# Patient Record
Sex: Female | Born: 1985
Health system: Southern US, Community
[De-identification: ages and names within clinical notes are randomized; demographics above are authoritative.]

## PROBLEM LIST (undated history)

## (undated) DIAGNOSIS — Z789 Other specified health status: Secondary | ICD-10-CM

## (undated) DIAGNOSIS — Z3490 Encounter for supervision of normal pregnancy, unspecified, unspecified trimester: Secondary | ICD-10-CM

## (undated) DIAGNOSIS — K219 Gastro-esophageal reflux disease without esophagitis: Secondary | ICD-10-CM

## (undated) HISTORY — PX: WISDOM TOOTH EXTRACTION: SHX21

---
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
## 2007-07-11 ENCOUNTER — Emergency Department (HOSPITAL_COMMUNITY): Admission: EM | Admit: 2007-07-11 | Discharge: 2007-07-11 | Payer: Self-pay | Admitting: Family Medicine

## 2007-07-22 ENCOUNTER — Inpatient Hospital Stay (HOSPITAL_COMMUNITY): Admission: AD | Admit: 2007-07-22 | Discharge: 2007-07-22 | Payer: Self-pay | Admitting: Obstetrics & Gynecology

## 2007-07-25 ENCOUNTER — Inpatient Hospital Stay (HOSPITAL_COMMUNITY): Admission: AD | Admit: 2007-07-25 | Discharge: 2007-07-26 | Payer: Self-pay | Admitting: Obstetrics and Gynecology

## 2007-09-04 ENCOUNTER — Inpatient Hospital Stay (HOSPITAL_COMMUNITY): Admission: AD | Admit: 2007-09-04 | Discharge: 2007-09-04 | Payer: Self-pay | Admitting: Obstetrics & Gynecology

## 2007-09-20 ENCOUNTER — Inpatient Hospital Stay (HOSPITAL_COMMUNITY): Admission: AD | Admit: 2007-09-20 | Discharge: 2007-09-20 | Payer: Self-pay | Admitting: Obstetrics & Gynecology

## 2007-11-18 ENCOUNTER — Ambulatory Visit (HOSPITAL_COMMUNITY): Admission: RE | Admit: 2007-11-18 | Discharge: 2007-11-18 | Payer: Self-pay | Admitting: Family Medicine

## 2008-02-18 ENCOUNTER — Inpatient Hospital Stay (HOSPITAL_COMMUNITY): Admission: AD | Admit: 2008-02-18 | Discharge: 2008-02-19 | Payer: Self-pay | Admitting: Obstetrics & Gynecology

## 2008-02-26 ENCOUNTER — Inpatient Hospital Stay (HOSPITAL_COMMUNITY): Admission: AD | Admit: 2008-02-26 | Discharge: 2008-02-27 | Payer: Self-pay | Admitting: Obstetrics & Gynecology

## 2008-02-27 ENCOUNTER — Inpatient Hospital Stay (HOSPITAL_COMMUNITY): Admission: AD | Admit: 2008-02-27 | Discharge: 2008-02-27 | Payer: Self-pay | Admitting: Family Medicine

## 2008-02-27 ENCOUNTER — Inpatient Hospital Stay (HOSPITAL_COMMUNITY): Admission: AD | Admit: 2008-02-27 | Discharge: 2008-03-01 | Payer: Self-pay | Admitting: Obstetrics & Gynecology

## 2008-02-27 ENCOUNTER — Ambulatory Visit: Payer: Self-pay | Admitting: Physician Assistant

## 2009-01-19 ENCOUNTER — Emergency Department (HOSPITAL_COMMUNITY): Admission: EM | Admit: 2009-01-19 | Discharge: 2009-01-19 | Payer: Self-pay | Admitting: Emergency Medicine

## 2009-04-07 ENCOUNTER — Emergency Department (HOSPITAL_COMMUNITY): Admission: EM | Admit: 2009-04-07 | Discharge: 2009-04-08 | Payer: Self-pay | Admitting: Emergency Medicine

## 2009-05-25 ENCOUNTER — Encounter: Admission: RE | Admit: 2009-05-25 | Discharge: 2009-05-25 | Payer: Self-pay | Admitting: Family Medicine

## 2010-05-28 LAB — DIFFERENTIAL
Basophils Absolute: 0 10*3/uL (ref 0.0–0.1)
Eosinophils Relative: 0 % (ref 0–5)
Lymphocytes Relative: 12 % (ref 12–46)
Monocytes Absolute: 0.6 10*3/uL (ref 0.1–1.0)
Neutro Abs: 7.5 10*3/uL (ref 1.7–7.7)
Neutrophils Relative %: 81 % — ABNORMAL HIGH (ref 43–77)

## 2010-05-28 LAB — COMPREHENSIVE METABOLIC PANEL
ALT: 70 U/L — ABNORMAL HIGH (ref 0–35)
Alkaline Phosphatase: 75 U/L (ref 39–117)
CO2: 29 mEq/L (ref 19–32)
GFR calc Af Amer: >60 mL/min (ref >60)
GFR calc non Af Amer: >60 mL/min (ref >60)
Glucose, Bld: 88 mg/dL (ref 70–99)
Potassium: 3.8 mEq/L (ref 3.5–5.1)

## 2010-05-28 LAB — CBC
HCT: 38.8 % (ref 36.0–46.0)
MCV: 84.8 fL (ref 78.0–100.0)
RBC: 4.57 MIL/uL (ref 3.87–5.11)
WBC: 9.3 10*3/uL (ref 4.0–10.5)

## 2010-05-28 LAB — URINE MICROSCOPIC-ADD ON

## 2010-05-28 LAB — LIPASE, BLOOD: Lipase: 72 U/L — ABNORMAL HIGH (ref 11–59)

## 2010-05-28 LAB — PREGNANCY, URINE: Preg Test, Ur: NEGATIVE

## 2010-05-28 LAB — URINALYSIS, ROUTINE W REFLEX MICROSCOPIC
Hgb urine dipstick: NEGATIVE
Nitrite: NEGATIVE
Protein, ur: NEGATIVE mg/dL
Urobilinogen, UA: 1 mg/dL (ref 0.0–1.0)

## 2010-11-24 ENCOUNTER — Other Ambulatory Visit: Payer: Self-pay | Admitting: Family Medicine

## 2010-11-24 DIAGNOSIS — N63 Unspecified lump in unspecified breast: Secondary | ICD-10-CM

## 2010-11-30 ENCOUNTER — Ambulatory Visit
Admission: RE | Admit: 2010-11-30 | Discharge: 2010-11-30 | Disposition: A | Discharge status: Home / Self Care | Payer: No Typology Code available for payment source | Source: Ambulatory Visit | Attending: Family Medicine | Admitting: Family Medicine

## 2010-11-30 DIAGNOSIS — N63 Unspecified lump in unspecified breast: Secondary | ICD-10-CM

## 2010-12-06 LAB — URINALYSIS, ROUTINE W REFLEX MICROSCOPIC
Bilirubin Urine: NEGATIVE
Hgb urine dipstick: NEGATIVE
Specific Gravity, Urine: >1.03 — ABNORMAL HIGH
Urobilinogen, UA: 0.2
pH: 5.5

## 2010-12-06 LAB — WET PREP, GENITAL
Clue Cells Wet Prep HPF POC: NONE SEEN
Trich, Wet Prep: NONE SEEN
Yeast Wet Prep HPF POC: NONE SEEN

## 2010-12-07 LAB — URINALYSIS, ROUTINE W REFLEX MICROSCOPIC
Ketones, ur: 15 — AB
Nitrite: NEGATIVE
Protein, ur: 30 — AB
pH: 8

## 2010-12-07 LAB — CBC
Hemoglobin: 12
MCHC: 33.6
Platelets: 242
RBC: 4.28
WBC: 5

## 2010-12-07 LAB — COMPREHENSIVE METABOLIC PANEL
AST: 71 — ABNORMAL HIGH
Albumin: 3.6
BUN: 2 — ABNORMAL LOW
Calcium: 9.9
Creatinine, Ser: 0.47
GFR calc Af Amer: >60

## 2010-12-07 LAB — URINE MICROSCOPIC-ADD ON

## 2010-12-15 LAB — URINALYSIS, ROUTINE W REFLEX MICROSCOPIC
Bilirubin Urine: NEGATIVE
Glucose, UA: NEGATIVE mg/dL
Hgb urine dipstick: NEGATIVE
Ketones, ur: NEGATIVE mg/dL
Nitrite: NEGATIVE
Protein, ur: NEGATIVE mg/dL
Specific Gravity, Urine: <1.005 — ABNORMAL LOW (ref 1.005–1.030)
Urobilinogen, UA: 1 mg/dL (ref 0.0–1.0)
pH: 6.5 (ref 5.0–8.0)

## 2010-12-15 LAB — CBC
MCV: 83.4 fL (ref 78.0–100.0)
Platelets: 264 10*3/uL (ref 150–400)
WBC: 10.1 10*3/uL (ref 4.0–10.5)

## 2010-12-21 LAB — URINALYSIS, ROUTINE W REFLEX MICROSCOPIC
Bilirubin Urine: NEGATIVE
Hgb urine dipstick: NEGATIVE
Ketones, ur: NEGATIVE
Nitrite: NEGATIVE
Protein, ur: NEGATIVE
Urobilinogen, UA: 0.2

## 2010-12-21 LAB — WET PREP, GENITAL
Clue Cells Wet Prep HPF POC: NONE SEEN
Trich, Wet Prep: NONE SEEN
Yeast Wet Prep HPF POC: NONE SEEN

## 2010-12-21 LAB — GC/CHLAMYDIA PROBE AMP, GENITAL: GC Probe Amp, Genital: NEGATIVE

## 2010-12-21 LAB — PREGNANCY, URINE: Preg Test, Ur: NEGATIVE

## 2011-06-14 ENCOUNTER — Other Ambulatory Visit: Payer: Self-pay | Admitting: Family Medicine

## 2012-02-08 ENCOUNTER — Encounter (HOSPITAL_COMMUNITY): Payer: Self-pay | Admitting: *Deleted

## 2012-02-08 ENCOUNTER — Emergency Department (HOSPITAL_COMMUNITY): Payer: Self-pay

## 2012-02-08 ENCOUNTER — Emergency Department (HOSPITAL_COMMUNITY)
Admission: EM | Admit: 2012-02-08 | Discharge: 2012-02-08 | Disposition: A | Discharge status: Home / Self Care | Payer: Self-pay | Attending: Emergency Medicine | Admitting: Emergency Medicine

## 2012-02-08 DIAGNOSIS — R079 Chest pain, unspecified: Secondary | ICD-10-CM

## 2012-02-08 DIAGNOSIS — R0789 Other chest pain: Secondary | ICD-10-CM | POA: Insufficient documentation

## 2012-02-08 DIAGNOSIS — F17200 Nicotine dependence, unspecified, uncomplicated: Secondary | ICD-10-CM | POA: Insufficient documentation

## 2012-02-08 MED ORDER — ONDANSETRON 8 MG PO TBDP
8.0000 mg | ORAL_TABLET | Freq: Once | ORAL | Status: AC
  Administered 2012-02-08: 8 mg via ORAL
  Filled 2012-02-08: qty 1

## 2012-02-08 MED ORDER — HYDROCODONE-ACETAMINOPHEN 5-325 MG PO TABS
1.0000 | ORAL_TABLET | Freq: Once | ORAL | Status: AC
  Administered 2012-02-08: 1 via ORAL
  Filled 2012-02-08: qty 1

## 2012-02-08 MED ORDER — HYDROCODONE-ACETAMINOPHEN 5-325 MG PO TABS: ORAL_TABLET | ORAL | Status: DC

## 2012-02-08 MED ORDER — OXYCODONE-ACETAMINOPHEN 5-325 MG PO TABS: 1.0000 | ORAL_TABLET | Freq: Four times a day (QID) | ORAL | Status: DC | PRN

## 2012-02-08 NOTE — ED Provider Notes (Signed)
Medical screening examination/treatment/procedure(s) were performed by non-physician practitioner and as supervising physician I was immediately available for consultation/collaboration.   Gavin Pound. Oletta Lamas, MD 02/08/12 2312

## 2012-02-08 NOTE — ED Notes (Signed)
Pt states she has chest pain that goes and comes, places hand over epigastric area, has been seen several times with same.

## 2012-02-08 NOTE — ED Notes (Signed)
Pt ready for d/c. Left room prior to signing.

## 2012-02-08 NOTE — ED Notes (Signed)
Pt now vomiting.  meds given.

## 2012-02-08 NOTE — ED Provider Notes (Signed)
History     CSN: [id number]  Arrival date & time 02/08/12  1632   First MD Initiated Contact with Patient 02/08/12 1654      Chief Complaint  Patient presents with  . Abdominal Pain    epigastric    (Consider location/radiation/quality/duration/timing/severity/associated sxs/prior treatment) HPI Comments: Patient presents with complaint of sternal chest pain that is intermittent. She has had this in the past several times, presented to the ED, and was never given an explanation for pain. Pain became worse today. It is not changed with movement or deep breathing. She has taken Tylenol today without relief as well as an acid medicine. She denies injury. No shortness of breath. No abdominal pain, fever, nausea, vomiting. No urinary symptoms. Patient has brain. No lower extremity swelling or tenderness. No recent immobilizations or long travel. No history of blood clots. No history of sudden death in family. Onset gradual. Course is constant. Patient is a smoker. Nothing makes symptoms better or worse.   The history is provided by the patient.    History reviewed. No pertinent past medical history.  History reviewed. No pertinent past surgical history.  No family history on file.  History  Substance Use Topics  . Smoking status: Current Every Day Smoker  . Smokeless tobacco: Not on file  . Alcohol Use: Yes     Comment: occ    OB History    Grav Para Term Preterm Abortions TAB SAB Ect Mult Living                  Review of Systems  Constitutional: Negative for fever.  HENT: Negative for sore throat and rhinorrhea.   Eyes: Negative for redness.  Respiratory: Negative for cough and shortness of breath.   Cardiovascular: Positive for chest pain. Negative for leg swelling.  Gastrointestinal: Negative for nausea, vomiting, abdominal pain and diarrhea.  Genitourinary: Negative for dysuria.  Musculoskeletal: Negative for myalgias.  Skin: Negative for rash.  Neurological:  Negative for headaches.    Allergies  Review of patient's allergies indicates no known allergies.  Home Medications   Current Outpatient Rx  Name  Route  Sig  Dispense  Refill  . ACETAMINOPHEN 500 MG PO TABS   Oral   Take 1,000 mg by mouth once. pain         . FAMOTIDINE 20 MG PO TABS   Oral   Take 20 mg by mouth once.           BP 97/63  Pulse 59  Temp 98.1 F (36.7 C) (Oral)  Resp 16  SpO2 100%  Physical Exam  Nursing note and vitals reviewed. Constitutional: She appears well-developed and well-nourished.  HENT:  Head: Normocephalic and atraumatic.  Eyes: Conjunctivae normal are normal. Right eye exhibits no discharge. Left eye exhibits no discharge.  Neck: Normal range of motion. Neck supple.  Cardiovascular: Normal rate, regular rhythm and normal heart sounds.   Pulmonary/Chest: Effort normal and breath sounds normal. No respiratory distress. She has no wheezes. She has no rales. She exhibits no tenderness.  Abdominal: Soft. There is no tenderness.  Neurological: She is alert.  Skin: Skin is warm and dry.  Psychiatric: She has a normal mood and affect.    ED Course  Procedures (including critical care time)  Labs Reviewed - No data to display No results found.   1. Chest pain     5:32 PM Patient seen and examined. Work-up initiated. Medications ordered. Previous visits reviewed.   Vital  signs reviewed and are as follows: Filed Vitals:   02/08/12 1643  BP: 97/63  Pulse: 59  Temp: 98.1 F (36.7 C)  Resp: 16    Date: 02/08/2012  Rate: 53  Rhythm: sinus bradycardia  QRS Axis: normal  Intervals: normal  ST/T Wave abnormalities: nonspecific T wave changes  Conduction Disutrbances:none  Narrative Interpretation:   Old EKG Reviewed: unchanged from 01/19/10 except for new bradycardia, t-wave abnormality pre-existing.   CXR neg. EKG reviewed. D/w Dr. Oletta Lamas. Pt will be d/c to home with ibuprofen, prilosec, and percocet (vomited in ED after  hydrocodone).   Patient was counseled to return with severe chest pain, especially if the pain is crushing or pressure-like and spreads to the arms, back, neck, or jaw, or if they have sweating, nausea, or shortness of breath with the pain. They were encouraged to call 911 with these symptoms.   They were also told to return if their chest pain gets worse and does not go away with rest, they have an attack of chest pain lasting longer than usual despite rest and treatment with the medications their caregiver has prescribed, if they wake from sleep with chest pain or shortness of breath, if they feel dizzy or faint, if they have chest pain not typical of their usual pain, or if they have any other emergent concerns regarding their health.  The patient verbalized understanding and agreed.    MDM  Pt with chest pain that is similar to previous episodes that resolved spontaneously in the past with PPI, pain medicine, anti-inflammatories. She is not short of breath. She does not have tachycardia or pleuritic chest pain. I do not suspect that this represents a PE. This is likely more of a chest wall pain. Doubt pericarditis. No pneumonia on chest x-ray. Will treat as previous. Patient appears well.         Renne Crigler, [place] 02/08/12 2033

## 2014-04-09 ENCOUNTER — Encounter (HOSPITAL_COMMUNITY): Payer: Self-pay | Admitting: Emergency Medicine

## 2014-04-09 ENCOUNTER — Emergency Department (HOSPITAL_COMMUNITY): Payer: Self-pay

## 2014-04-09 ENCOUNTER — Emergency Department (HOSPITAL_COMMUNITY)
Admission: EM | Admit: 2014-04-09 | Discharge: 2014-04-09 | Disposition: A | Discharge status: Home / Self Care | Payer: Self-pay | Attending: Emergency Medicine | Admitting: Emergency Medicine

## 2014-04-09 DIAGNOSIS — R1013 Epigastric pain: Secondary | ICD-10-CM

## 2014-04-09 DIAGNOSIS — R079 Chest pain, unspecified: Secondary | ICD-10-CM | POA: Insufficient documentation

## 2014-04-09 DIAGNOSIS — Z87891 Personal history of nicotine dependence: Secondary | ICD-10-CM | POA: Insufficient documentation

## 2014-04-09 DIAGNOSIS — K8050 Calculus of bile duct without cholangitis or cholecystitis without obstruction: Secondary | ICD-10-CM | POA: Insufficient documentation

## 2014-04-09 LAB — CBC WITH DIFFERENTIAL/PLATELET
BASOS ABS: 0 10*3/uL (ref 0.0–0.1)
BASOS PCT: 0 % (ref 0–1)
EOS ABS: 0.1 10*3/uL (ref 0.0–0.7)
Eosinophils Relative: 3 % (ref 0–5)
HCT: 36.8 % (ref 36.0–46.0)
Hemoglobin: 11.7 g/dL — ABNORMAL LOW (ref 12.0–15.0)
LYMPHS PCT: 31 % (ref 12–46)
Lymphs Abs: 1.5 10*3/uL (ref 0.7–4.0)
MCH: 27.9 pg (ref 26.0–34.0)
MCHC: 31.8 g/dL (ref 30.0–36.0)
MCV: 87.8 fL (ref 78.0–100.0)
Monocytes Absolute: 0.4 10*3/uL (ref 0.1–1.0)
Monocytes Relative: 9 % (ref 3–12)
Neutro Abs: 2.8 10*3/uL (ref 1.7–7.7)
Neutrophils Relative %: 57 % (ref 43–77)
PLATELETS: 222 10*3/uL (ref 150–400)
RBC: 4.19 MIL/uL (ref 3.87–5.11)
RDW: 13.8 % (ref 11.5–15.5)
WBC: 4.9 10*3/uL (ref 4.0–10.5)

## 2014-04-09 LAB — BASIC METABOLIC PANEL
Anion gap: 8 (ref 5–15)
BUN: 12 mg/dL (ref 6–23)
CO2: 26 mmol/L (ref 19–32)
CREATININE: 0.64 mg/dL (ref 0.50–1.10)
Calcium: 9.3 mg/dL (ref 8.4–10.5)
Chloride: 105 mmol/L (ref 96–112)
GFR calc non Af Amer: >90 mL/min (ref >90)
GLUCOSE: 91 mg/dL (ref 70–99)
Potassium: 3.8 mmol/L (ref 3.5–5.1)
SODIUM: 139 mmol/L (ref 135–145)

## 2014-04-09 LAB — HEPATIC FUNCTION PANEL
ALT: 12 U/L (ref 0–35)
AST: 17 U/L (ref 0–37)
Albumin: 4.1 g/dL (ref 3.5–5.2)
Alkaline Phosphatase: 53 U/L (ref 39–117)
BILIRUBIN TOTAL: 0.4 mg/dL (ref 0.3–1.2)
Total Protein: 7.4 g/dL (ref 6.0–8.3)

## 2014-04-09 LAB — I-STAT TROPONIN, ED: Troponin i, poc: 0 ng/mL (ref 0.00–0.08)

## 2014-04-09 LAB — I-STAT BETA HCG BLOOD, ED (MC, WL, AP ONLY): I-stat hCG, quantitative: <5 m[IU]/mL (ref <5)

## 2014-04-09 LAB — LIPASE, BLOOD: LIPASE: 23 U/L (ref 11–59)

## 2014-04-09 MED ORDER — OXYCODONE-ACETAMINOPHEN 5-325 MG PO TABS: 1.0000 | ORAL_TABLET | Freq: Four times a day (QID) | ORAL | Status: DC | PRN

## 2014-04-09 MED ORDER — OXYCODONE-ACETAMINOPHEN 5-325 MG PO TABS
1.0000 | ORAL_TABLET | Freq: Once | ORAL | Status: AC
  Administered 2014-04-09: 1 via ORAL
  Filled 2014-04-09: qty 1

## 2014-04-09 NOTE — ED Provider Notes (Signed)
CSN: [id number]     Arrival date & time 04/09/14  0545 History   First MD Initiated Contact with Patient 04/09/14 0700     Chief Complaint  Patient presents with  . Chest Pain     (Consider location/radiation/quality/duration/timing/severity/associated sxs/prior Treatment) Patient is a 29 y.o. female presenting with chest pain. The history is provided by the patient.  Chest Pain Associated symptoms: abdominal pain and nausea   Associated symptoms: no back pain, no headache, no numbness, no shortness of breath, not vomiting and no weakness    patient with chest pain. Began last night. States it was 10 out of 10 but is now 7 out of 10. States she has had episodes like this in the past but only comes once every blue moon. She states she's been told it was gallstones she states one time it was popped blood vessel and one time they were not able to tell her what it was. Rates the pain starts her abdomen goes up. Mild nausea without vomiting. The pain is dull and constant. She did not eat breakfast. It woke her from sleep. No fevers. No diaphoresis. She does have decreased appetite. No weight loss.  History reviewed. No pertinent past medical history. History reviewed. No pertinent past surgical history. History reviewed. No pertinent family history. History  Substance Use Topics  . Smoking status: Former Research scientist (life sciences)  . Smokeless tobacco: Not on file  . Alcohol Use: Yes     Comment: occ   OB History    No data available     Review of Systems  Constitutional: Negative for activity change and appetite change.  Eyes: Negative for pain.  Respiratory: Negative for chest tightness and shortness of breath.   Cardiovascular: Positive for chest pain. Negative for leg swelling.  Gastrointestinal: Positive for nausea and abdominal pain. Negative for vomiting and diarrhea.  Genitourinary: Negative for flank pain.  Musculoskeletal: Negative for back pain and neck stiffness.  Skin: Negative for rash.   Neurological: Negative for weakness, numbness and headaches.  Psychiatric/Behavioral: Negative for behavioral problems.      Allergies  Review of patient's allergies indicates no known allergies.  Home Medications   Prior to Admission medications   Medication Sig Start Date End Date Taking? Authorizing Provider  acetaminophen (TYLENOL) 500 MG tablet Take 1,000 mg by mouth once. pain    Historical Provider, MD  famotidine (PEPCID) 20 MG tablet Take 20 mg by mouth once.    Historical Provider, MD  oxyCODONE-acetaminophen (PERCOCET/ROXICET) 5-325 MG per tablet Take 1-2 tablets by mouth every 6 (six) hours as needed. 04/09/14   Jasper Riling. Nataliee Shurtz, MD   BP 104/59 mmHg  Pulse 80  Temp(Src) 97.9 F (36.6 C) (Oral)  Resp 18  SpO2 100%  LMP 03/26/2014 (Approximate) Physical Exam  Constitutional: She appears well-developed.  HENT:  Head: Normocephalic.  Cardiovascular: Normal rate and regular rhythm.   Pulmonary/Chest: Effort normal.  Abdominal: Soft. There is tenderness.  Epigastric tenderness without rebound or guarding.  Musculoskeletal: Normal range of motion.  Neurological: She is alert.  Skin: Skin is warm.    ED Course  Procedures (including critical care time) Labs Review Labs Reviewed  CBC WITH DIFFERENTIAL/PLATELET - Abnormal; Notable for the following:    Hemoglobin 11.7 (*)    All other components within normal limits  BASIC METABOLIC PANEL  HEPATIC FUNCTION PANEL  LIPASE, BLOOD  I-STAT BETA HCG BLOOD, ED ([facility], [facility], [facility] ONLY)  I-STAT TROPOININ, ED    Imaging Review Dg Chest  2 View  04/09/2014   CLINICAL DATA:  New onset Mid chest pain since last night. Nonsmoker.  EXAM: CHEST  2 VIEW  COMPARISON:  02/08/2012  FINDINGS: The heart size and mediastinal contours are within normal limits. Both lungs are clear. The visualized skeletal structures are unremarkable.  IMPRESSION: No active cardiopulmonary disease.   Electronically Signed   By: Lucienne Capers M.D.   On:  04/09/2014 06:59     EKG Interpretation   Date/Time:  Friday April 09 2014 06:14:49 EST Ventricular Rate:  64 PR Interval:  145 QRS Duration: 71 QT Interval:  394 QTC Calculation: 406 R Axis:   81 Text Interpretation:  Sinus rhythm ST elevation, consider inferior injury  have not changed Confirmed by NANAVATI, MD, ANKIT [phone]) on 04/09/2014  6:52:37 AM      MDM   Final diagnoses:  Chest pain  Biliary colic  Epigastric pain    Patient with chest pain and epigastric pain. Has had history of same and has had previous gallstones. This may be biliary colic. Laboratory reassuring. Will discharge and a follow-up with general surgery as needed. Will not repeat ultrasound at this time.    Jasper Riling. Alvino Chapel, MD 04/09/14 1539

## 2014-04-09 NOTE — Discharge Instructions (Signed)
Biliary Colic  °Biliary colic is a steady or irregular pain in the upper abdomen. It is usually under the right side of the rib cage. It happens when gallstones interfere with the normal flow of bile from the gallbladder. Bile is a liquid that helps to digest fats. Bile is made in the liver and stored in the gallbladder. When you eat a meal, bile passes from the gallbladder through the cystic duct and the common bile duct into the small intestine. There, it mixes with partially digested food. If a gallstone blocks either of these ducts, the normal flow of bile is blocked. The muscle cells in the bile duct contract forcefully to try to move the stone. This causes the pain of biliary colic.  °SYMPTOMS  °· A person with biliary colic usually complains of pain in the upper abdomen. This pain can be: °· In the center of the upper abdomen just below the breastbone. °· In the upper-right part of the abdomen, near the gallbladder and liver. °· Spread back toward the right shoulder blade. °· Nausea and vomiting. °· The pain usually occurs after eating. °· Biliary colic is usually triggered by the digestive system's demand for bile. The demand for bile is high after fatty meals. Symptoms can also occur when a person who has been fasting suddenly eats a very large meal. Most episodes of biliary colic pass after 1 to 5 hours. After the most intense pain passes, your abdomen may continue to ache mildly for about 24 hours. °DIAGNOSIS  °After you describe your symptoms, your caregiver will perform a physical exam. He or she will pay attention to the upper right portion of your belly (abdomen). This is the area of your liver and gallbladder. An ultrasound will help your caregiver look for gallstones. Specialized scans of the gallbladder may also be done. Blood tests may be done, especially if you have fever or if your pain persists. °PREVENTION  °Biliary colic can be prevented by controlling the risk factors for gallstones. Some of  these risk factors, such as heredity, increasing age, and pregnancy are a normal part of life. Obesity and a high-fat diet are risk factors you can change through a healthy lifestyle. Women going through menopause who take hormone replacement therapy (estrogen) are also more likely to develop biliary colic. °TREATMENT  °· Pain medication may be prescribed. °· You may be encouraged to eat a fat-free diet. °· If the first episode of biliary colic is severe, or episodes of colic keep retuning, surgery to remove the gallbladder (cholecystectomy) is usually recommended. This procedure can be done through small incisions using an instrument called a laparoscope. The procedure often requires a brief stay in the hospital. Some people can leave the hospital the same day. It is the most widely used treatment in people troubled by painful gallstones. It is effective and safe, with no complications in more than 90% of cases. °· If surgery cannot be done, medication that dissolves gallstones may be used. This medication is expensive and can take months or years to work. Only small stones will dissolve. °· Rarely, medication to dissolve gallstones is combined with a procedure called shock-wave lithotripsy. This procedure uses carefully aimed shock waves to break up gallstones. In many people treated with this procedure, gallstones form again within a few years. °PROGNOSIS  °If gallstones block your cystic duct or common bile duct, you are at risk for repeated episodes of biliary colic. There is also a 25% chance that you will develop   a gallbladder infection(acute cholecystitis), or some other complication of gallstones within 10 to 20 years. If you have surgery, schedule it at a time that is convenient for you and at a time when you are not sick. HOME CARE INSTRUCTIONS   Drink plenty of clear fluids.  Avoid fatty, greasy or fried foods, or any foods that make your pain worse.  Take medications as directed. SEEK MEDICAL  CARE IF:   You develop a fever over 100.5 F (38.1 C).  Your pain gets worse over time.  You develop nausea that prevents you from eating and drinking.  You develop vomiting. SEEK IMMEDIATE MEDICAL CARE IF:   You have continuous or severe belly (abdominal) pain which is not relieved with medications.  You develop nausea and vomiting which is not relieved with medications.  You have symptoms of biliary colic and you suddenly develop a fever and shaking chills. This may signal cholecystitis. Call your caregiver immediately.  You develop a yellow color to your skin or the white part of your eyes (jaundice). Document Released: 07/30/2005 Document Revised: 05/21/2011 Document Reviewed: 10/09/2007 [facility] Patient Information 2015 [place], [place]. This information is not intended to replace advice given to you by your health care provider. Make sure you discuss any questions you have with your health care provider.  Abdominal Pain Many things can cause abdominal pain. Usually, abdominal pain is not caused by a disease and will improve without treatment. It can often be observed and treated at home. Your health care provider will do a physical exam and possibly order blood tests and X-rays to help determine the seriousness of your pain. However, in many cases, more time must pass before a clear cause of the pain can be found. Before that point, your health care provider may not know if you need more testing or further treatment. HOME CARE INSTRUCTIONS  Monitor your abdominal pain for any changes. The following actions may help to alleviate any discomfort you are experiencing:  Only take over-the-counter or prescription medicines as directed by your health care provider.  Do not take laxatives unless directed to do so by your health care provider.  Try a clear liquid diet (broth, tea, or water) as directed by your health care provider. Slowly move to a bland diet as tolerated. SEEK MEDICAL  CARE IF:  You have unexplained abdominal pain.  You have abdominal pain associated with nausea or diarrhea.  You have pain when you urinate or have a bowel movement.  You experience abdominal pain that wakes you in the night.  You have abdominal pain that is worsened or improved by eating food.  You have abdominal pain that is worsened with eating fatty foods.  You have a fever. SEEK IMMEDIATE MEDICAL CARE IF:   Your pain does not go away within 2 hours.  You keep throwing up (vomiting).  Your pain is felt only in portions of the abdomen, such as the right side or the left lower portion of the abdomen.  You pass bloody or black tarry stools. MAKE SURE YOU:  Understand these instructions.   Will watch your condition.   Will get help right away if you are not doing well or get worse.  Document Released: 12/06/2004 Document Revised: 03/03/2013 Document Reviewed: 11/05/2012 [facility] Patient Information 2015 [place], [place]. This information is not intended to replace advice given to you by your health care provider. Make sure you discuss any questions you have with your health care provider.

## 2014-04-09 NOTE — ED Notes (Signed)
Pt is c/o pain to the center of her chest that she states woke her up from her sleep  Pt denies any other sxs at this time  Pt states the pain went away for about 30 minutes then returned

## 2014-12-16 ENCOUNTER — Encounter (HOSPITAL_COMMUNITY): Payer: Self-pay | Admitting: *Deleted

## 2014-12-16 ENCOUNTER — Inpatient Hospital Stay (HOSPITAL_COMMUNITY)
Admission: AD | Admit: 2014-12-16 | Discharge: 2014-12-16 | Disposition: A | Discharge status: Home / Self Care | Payer: Medicaid Other | Source: Ambulatory Visit | Attending: Family Medicine | Admitting: Family Medicine

## 2014-12-16 DIAGNOSIS — Z3202 Encounter for pregnancy test, result negative: Secondary | ICD-10-CM | POA: Insufficient documentation

## 2014-12-16 DIAGNOSIS — Z87891 Personal history of nicotine dependence: Secondary | ICD-10-CM | POA: Diagnosis not present

## 2014-12-16 DIAGNOSIS — K219 Gastro-esophageal reflux disease without esophagitis: Secondary | ICD-10-CM | POA: Diagnosis not present

## 2014-12-16 DIAGNOSIS — R079 Chest pain, unspecified: Secondary | ICD-10-CM | POA: Diagnosis present

## 2014-12-16 HISTORY — DX: Other specified health status: Z78.9

## 2014-12-16 LAB — URINALYSIS, ROUTINE W REFLEX MICROSCOPIC
BILIRUBIN URINE: NEGATIVE
GLUCOSE, UA: NEGATIVE mg/dL
Hgb urine dipstick: NEGATIVE
KETONES UR: NEGATIVE mg/dL
LEUKOCYTES UA: NEGATIVE
NITRITE: NEGATIVE
PH: 6 (ref 5.0–8.0)
Protein, ur: NEGATIVE mg/dL
SPECIFIC GRAVITY, URINE: 1.025 (ref 1.005–1.030)
Urobilinogen, UA: 0.2 mg/dL (ref 0.0–1.0)

## 2014-12-16 LAB — POCT PREGNANCY, URINE: Preg Test, Ur: NEGATIVE

## 2014-12-16 MED ORDER — GI COCKTAIL ~~LOC~~
30.0000 mL | Freq: Once | ORAL | Status: AC
  Administered 2014-12-16: 30 mL via ORAL
  Filled 2014-12-16: qty 30

## 2014-12-16 NOTE — MAU Note (Addendum)
Pt reports chest pain for two hours. Pt states that she took tums at with no relief. Pt reports that she has had this pain in the past and in comes "once in a blue moon". Pt ate some pizza about 30 minutes ago because she thought it would make the pain feel better.

## 2014-12-16 NOTE — MAU Note (Signed)
Pt states that she started having chest pain 2 hours ago that radiates to back. Denies SOB, N/V. States she had an insemination on Tuesday and is unsure if pregnant. Denies any OBGYN issues

## 2014-12-16 NOTE — Discharge Instructions (Signed)
Food Choices for Gastroesophageal Reflux Disease, Adult When you have gastroesophageal reflux disease (GERD), the foods you eat and your eating habits are very important. Choosing the right foods can help ease the discomfort of GERD. WHAT GENERAL GUIDELINES DO I NEED TO FOLLOW?  Choose fruits, vegetables, whole grains, low-fat dairy products, and low-fat meat, fish, and poultry.  Limit fats such as oils, salad dressings, butter, nuts, and avocado.  Keep a food diary to identify foods that cause symptoms.  Avoid foods that cause reflux. These may be different for different people.  Eat frequent small meals instead of three large meals each day.  Eat your meals slowly, in a relaxed setting.  Limit fried foods.  Cook foods using methods other than frying.  Avoid drinking alcohol.  Avoid drinking large amounts of liquids with your meals.  Avoid bending over or lying down until 2-3 hours after eating. WHAT FOODS ARE NOT RECOMMENDED? The following are some foods and drinks that may worsen your symptoms: Vegetables Tomatoes. Tomato juice. Tomato and spaghetti sauce. Chili peppers. Onion and garlic. Horseradish. Fruits Oranges, grapefruit, and lemon (fruit and juice). Meats High-fat meats, fish, and poultry. This includes hot dogs, ribs, ham, sausage, salami, and bacon. Dairy Whole milk and chocolate milk. Sour cream. Cream. Butter. Ice cream. Cream cheese.  Beverages Coffee and tea, with or without caffeine. Carbonated beverages or energy drinks. Condiments Hot sauce. Barbecue sauce.  Sweets/Desserts Chocolate and cocoa. Donuts. Peppermint and spearmint. Fats and Oils High-fat foods, including French fries and potato chips. Other Vinegar. Strong spices, such as black pepper, white pepper, red pepper, cayenne, curry powder, cloves, ginger, and chili powder. The items listed above may not be a complete list of foods and beverages to avoid. Contact your dietitian for more  information.   This information is not intended to replace advice given to you by your health care provider. Make sure you discuss any questions you have with your health care provider.   Document Released: 02/26/2005 Document Revised: 03/19/2014 Document Reviewed: 12/31/2012 [employer] Interactive Patient Education 2016 [employer]  

## 2014-12-16 NOTE — MAU Provider Note (Signed)
History     CSN: [id number]  Arrival date and time: 12/16/14 2221   First Provider Initiated Contact with Patient 12/16/14 2245      Chief Complaint  Patient presents with  . Chest Pain   HPI Comments: Tiffany Blair is a 29 y.o. [id number] who presents today with chest pain. She states that she came to the [facility] because she was inseminated on Tuesday, and she was worried that she maybe pregnant. She states that this pain is similar to the pain she had in January when she was seen in the ED .  Chest Pain  This is a new problem. The current episode started today. The onset quality is sudden. The problem occurs constantly. The problem has been unchanged. The pain is present in the epigastric region. The pain is at a severity of 7/10. The quality of the pain is described as pressure. The pain radiates to the mid back. Pertinent negatives include no abdominal pain, nausea, shortness of breath or vomiting. The pain is aggravated by nothing. Treatments tried: tums. The treatment provided no relief.   Past Medical History  Diagnosis Date  . Medical history non-contributory     No past surgical history on file.  No family history on file.  Social History  Substance Use Topics  . Smoking status: Former Research scientist (life sciences)  . Smokeless tobacco: Not on file  . Alcohol Use: Yes     Comment: occ    Allergies: No Known Allergies  Prescriptions prior to admission  Medication Sig Dispense Refill Last Dose  . calcium carbonate (TUMS - DOSED IN MG ELEMENTAL CALCIUM) 500 MG chewable tablet Chew 2 tablets by mouth daily.   12/16/2014 at Unknown time  . acetaminophen (TYLENOL) 500 MG tablet Take 1,000 mg by mouth once. pain   More than a month at Unknown time  . famotidine (PEPCID) 20 MG tablet Take 20 mg by mouth once.   More than a month at Unknown time  . oxyCODONE-acetaminophen (PERCOCET/ROXICET) 5-325 MG per tablet Take 1-2 tablets by mouth every 6 (six) hours as needed. 10 tablet 0 More than a month at  Unknown time    Review of Systems  Respiratory: Negative for shortness of breath.   Cardiovascular: Positive for chest pain.  Gastrointestinal: Positive for constipation. Negative for nausea, vomiting, abdominal pain and diarrhea.  Genitourinary: Negative for dysuria, urgency and frequency.   Physical Exam   Blood pressure 102/41, pulse 62, temperature 98.6 F (37 C), temperature source Oral, resp. rate 18, height 5\' 2"  (1.575 m), last menstrual period 12/01/2014, SpO2 100 %.  Physical Exam  Nursing note and vitals reviewed. Constitutional: She is oriented to person, place, and time. She appears well-developed and well-nourished. No distress.  HENT:  Head: Normocephalic.  Cardiovascular: Normal rate.   Respiratory: Effort normal.  GI: Soft.  Neurological: She is alert and oriented to person, place, and time.  Skin: Skin is warm and dry.  Psychiatric: She has a normal mood and affect.    Results for orders placed or performed during the hospital encounter of 12/16/14 (from the past 24 hour(s))  Pregnancy, urine POC     Status: None   Collection Time: 12/16/14 11:12 PM  Result Value Ref Range   Preg Test, Ur NEGATIVE NEGATIVE     MAU Course  Procedures  MDM 2240: Dr. Nehemiah Settle reviewed EKG. States that it is normal. We can try GI cocktail, and if no improvement then needs to be transferred to [facility].  2333:  Patient has had GI cocktail. She reports that her pain has improved.   Assessment and Plan   1. Gastroesophageal reflux disease, esophagitis presence not specified    DC home Comfort measures reviewed  Fetal kick counts RX: none    Follow-up Information    Follow up with [place] [facility].   Why:  if symptoms return    Contact information:   [place] [phone]         Mathis Bud 12/16/2014, 10:48 PM

## 2014-12-28 ENCOUNTER — Encounter (HOSPITAL_COMMUNITY): Payer: Self-pay | Admitting: *Deleted

## 2014-12-28 ENCOUNTER — Inpatient Hospital Stay (HOSPITAL_COMMUNITY)
Admission: AD | Admit: 2014-12-28 | Discharge: 2014-12-28 | Disposition: A | Discharge status: Home / Self Care | Payer: Medicaid Other | Source: Ambulatory Visit | Attending: Obstetrics and Gynecology | Admitting: Obstetrics and Gynecology

## 2014-12-28 DIAGNOSIS — Z3202 Encounter for pregnancy test, result negative: Secondary | ICD-10-CM

## 2014-12-28 DIAGNOSIS — R109 Unspecified abdominal pain: Secondary | ICD-10-CM | POA: Diagnosis present

## 2014-12-28 DIAGNOSIS — Z87891 Personal history of nicotine dependence: Secondary | ICD-10-CM | POA: Diagnosis not present

## 2014-12-28 DIAGNOSIS — N946 Dysmenorrhea, unspecified: Secondary | ICD-10-CM | POA: Insufficient documentation

## 2014-12-28 LAB — URINALYSIS, ROUTINE W REFLEX MICROSCOPIC
BILIRUBIN URINE: NEGATIVE
Glucose, UA: NEGATIVE mg/dL
KETONES UR: 15 mg/dL — AB
Leukocytes, UA: NEGATIVE
NITRITE: NEGATIVE
PH: 6 (ref 5.0–8.0)
Protein, ur: NEGATIVE mg/dL
Specific Gravity, Urine: >1.03 — ABNORMAL HIGH (ref 1.005–1.030)
UROBILINOGEN UA: 1 mg/dL (ref 0.0–1.0)

## 2014-12-28 LAB — CBC
HEMATOCRIT: 33.7 % — AB (ref 36.0–46.0)
HEMOGLOBIN: 10.8 g/dL — AB (ref 12.0–15.0)
MCH: 27.9 pg (ref 26.0–34.0)
MCHC: 32 g/dL (ref 30.0–36.0)
MCV: 87.1 fL (ref 78.0–100.0)
PLATELETS: 234 10*3/uL (ref 150–400)
RBC: 3.87 MIL/uL (ref 3.87–5.11)
RDW: 13.7 % (ref 11.5–15.5)
WBC: 5 10*3/uL (ref 4.0–10.5)

## 2014-12-28 LAB — URINE MICROSCOPIC-ADD ON

## 2014-12-28 LAB — HCG, QUANTITATIVE, PREGNANCY: hCG, Beta Chain, Quant, S: <1 m[IU]/mL (ref <5)

## 2014-12-28 MED ORDER — IBUPROFEN 600 MG PO TABS: 600.0000 mg | ORAL_TABLET | Freq: Four times a day (QID) | ORAL | Status: DC | PRN

## 2014-12-28 NOTE — Discharge Instructions (Signed)
Dysmenorrhea °Menstrual cramps (dysmenorrhea) are caused by the muscles of the uterus tightening (contracting) during a menstrual period. For some women, this discomfort is merely bothersome. For others, dysmenorrhea can be severe enough to interfere with everyday activities for a few days each month. °Primary dysmenorrhea is menstrual cramps that last a couple of days when you start having menstrual periods or soon after. This often begins after a teenager starts having her period. As a woman gets older or has a baby, the cramps will usually lessen or disappear. Secondary dysmenorrhea begins later in life, lasts longer, and the pain may be stronger than primary dysmenorrhea. The pain may start before the period and last a few days after the period.  °CAUSES  °Dysmenorrhea is usually caused by an underlying problem, such as: °· The tissue lining the uterus grows outside of the uterus in other areas of the body (endometriosis). °· The endometrial tissue, which normally lines the uterus, is found in or grows into the muscular walls of the uterus (adenomyosis). °· The pelvic blood vessels are engorged with blood just before the menstrual period (pelvic congestive syndrome). °· Overgrowth of cells (polyps) in the lining of the uterus or cervix. °· Falling down of the uterus (prolapse) because of loose or stretched ligaments. °· Depression. °· Bladder problems, infection, or inflammation. °· Problems with the intestine, a tumor, or irritable bowel syndrome. °· Cancer of the female organs or bladder. °· A severely tipped uterus. °· A very tight opening or closed cervix. °· Noncancerous tumors of the uterus (fibroids). °· Pelvic inflammatory disease (PID). °· Pelvic scarring (adhesions) from a previous surgery. °· Ovarian cyst. °· An intrauterine device (IUD) used for birth control. °RISK FACTORS °You may be at greater risk of dysmenorrhea if: °· You are younger than age 30. °· You started puberty early. °· You have  irregular or heavy bleeding. °· You have never given birth. °· You have a family history of this problem. °· You are a smoker. °SIGNS AND SYMPTOMS  °· Cramping or throbbing pain in your lower abdomen. °· Headaches. °· Lower back pain. °· Nausea or vomiting. °· Diarrhea. °· Sweating or dizziness. °· Loose stools. °DIAGNOSIS  °A diagnosis is based on your history, symptoms, physical exam, diagnostic tests, or procedures. Diagnostic tests or procedures may include: °· Blood tests. °· Ultrasonography. °· An examination of the lining of the uterus (dilation and curettage, D&C). °· An examination inside your abdomen or pelvis with a scope (laparoscopy). °· X-rays. °· CT scan. °· MRI. °· An examination inside the bladder with a scope (cystoscopy). °· An examination inside the intestine or stomach with a scope (colonoscopy, gastroscopy). °TREATMENT  °Treatment depends on the cause of the dysmenorrhea. Treatment may include: °· Pain medicine prescribed by your health care provider. °· Birth control pills or an IUD with progesterone hormone in it. °· Hormone replacement therapy. °· Nonsteroidal anti-inflammatory drugs (NSAIDs). These may help stop the production of prostaglandins. °· Surgery to remove adhesions, endometriosis, ovarian cyst, or fibroids. °· Removal of the uterus (hysterectomy). °· Progesterone shots to stop the menstrual period. °· Cutting the nerves on the sacrum that go to the female organs (presacral neurectomy). °· Electric current to the sacral nerves (sacral nerve stimulation). °· Antidepressant medicine. °· Psychiatric therapy, counseling, or group therapy. °· Exercise and physical therapy. °· Meditation and yoga therapy. °· Acupuncture. °HOME CARE INSTRUCTIONS  °· Only take over-the-counter or prescription medicines as directed by your health care provider. °· Place a heating pad   or hot water bottle on your lower back or abdomen. Do not sleep with the heating pad.  Use aerobic exercises, walking,  swimming, biking, and other exercises to help lessen the cramping.  Massage to the lower back or abdomen may help.  Stop smoking.  Avoid alcohol and caffeine. SEEK MEDICAL CARE IF:   Your pain does not get better with medicine.  You have pain with sexual intercourse.  Your pain increases and is not controlled with medicines.  You have abnormal vaginal bleeding with your period.  You develop nausea or vomiting with your period that is not controlled with medicine. SEEK IMMEDIATE MEDICAL CARE IF:  You pass out.    This information is not intended to replace advice given to you by your health care provider. Make sure you discuss any questions you have with your health care provider.   Document Released: 02/26/2005 Document Revised: 10/29/2012 Document Reviewed: 08/14/2012 Elsevier Interactive Patient Education 2016 [employer].   Pregnancy Test Information WHAT IS A PREGNANCY TEST? A pregnancy test is used to detect the presence of human chorionic gonadotropin (hCG) in a sample of your urine or blood. hCG is a hormone produced by the cells of the placenta. The placenta is the organ that forms to nourish and support a developing baby. This test requires a sample of either blood or urine. A pregnancy test determines whether you are pregnant or not. HOW ARE PREGNANCY TESTS DONE? Pregnancy tests are done using a home pregnancy test or having a blood or urine test done at your health care provider's office.  Home pregnancy tests require a urine sample.  Most kits use a plastic testing device with a strip of paper that indicates whether there is hCG in your urine.  Follow the test instructions very carefully.  After you urinate on the test stick, markings will appear to let you know whether you are pregnant.  For best results, use your first urine of the morning. That is when the concentration of hCG is highest. Having a blood test to check for pregnancy requires a sample of blood  drawn from a vein in your hand or arm. Your health care provider will send your sample to a lab for testing. Results of a pregnancy test will be positive or negative. IS ONE TYPE OF PREGNANCY TEST BETTER THAN ANOTHER? In some cases, a blood test will return a positive result even if a urine test was negative because blood tests are more sensitive. This means blood tests can detect hCG earlier than home pregnancy tests.  HOW ACCURATE ARE HOME PREGNANCY TESTS?  Both types of pregnancy tests are very accurate.  A blood test is about 98% accurate.  When you are far enough along in your pregnancy and when used correctly, home pregnancy tests are equally accurate. CAN ANYTHING INTERFERE WITH HOME PREGNANCY TEST RESULTS?  It is possible for certain conditions to cause an inaccurate test result (false positive or falsenegative).  A false positive is a positive test result when you are not pregnant. This can happen if you:  Are taking certain medicines, including anticonvulsants or tranquilizers.  Have certain proteins in your blood.  A false negative is a negative test result when you are pregnant. This can happen if you:  Took the test before there was enough hCG to detect. A pregnancy test will not be positive in most women until 3-4 weeks after conception.  Drank a lot of liquid before the test. Diluted urine samples can sometimes give  an inaccurate result.  Take certain medicines, such as water pills (diuretics) or some antihistamines.  Pregnancy Test Information WHAT IS A PREGNANCY TEST? A pregnancy test is used to detect the presence of human chorionic gonadotropin (hCG) in a sample of your urine or blood. hCG is a hormone produced by the cells of the placenta. The placenta is the organ that forms to nourish and support a developing baby. This test requires a sample of either blood or urine. A pregnancy test determines whether you are pregnant or not. HOW ARE PREGNANCY TESTS  DONE? Pregnancy tests are done using a home pregnancy test or having a blood or urine test done at your health care provider's office.  Home pregnancy tests require a urine sample.  Most kits use a plastic testing device with a strip of paper that indicates whether there is hCG in your urine.  Follow the test instructions very carefully.  After you urinate on the test stick, markings will appear to let you know whether you are pregnant.  For best results, use your first urine of the morning. That is when the concentration of hCG is highest. Having a blood test to check for pregnancy requires a sample of blood drawn from a vein in your hand or arm. Your health care provider will send your sample to a lab for testing. Results of a pregnancy test will be positive or negative. IS ONE TYPE OF PREGNANCY TEST BETTER THAN ANOTHER? In some cases, a blood test will return a positive result even if a urine test was negative because blood tests are more sensitive. This means blood tests can detect hCG earlier than home pregnancy tests.  HOW ACCURATE ARE HOME PREGNANCY TESTS?  Both types of pregnancy tests are very accurate.  A blood test is about 98% accurate.  When you are far enough along in your pregnancy and when used correctly, home pregnancy tests are equally accurate. CAN ANYTHING INTERFERE WITH HOME PREGNANCY TEST RESULTS?  It is possible for certain conditions to cause an inaccurate test result (false positive or falsenegative).  A false positive is a positive test result when you are not pregnant. This can happen if you:  Are taking certain medicines, including anticonvulsants or tranquilizers.  Have certain proteins in your blood.  A false negative is a negative test result when you are pregnant. This can happen if you:  Took the test before there was enough hCG to detect. A pregnancy test will not be positive in most women until 3-4 weeks after conception.  Drank a lot of liquid  before the test. Diluted urine samples can sometimes give an inaccurate result.  Take certain medicines, such as water pills (diuretics) or some antihistamines. WHAT SHOULD I DO IF I HAVE A POSITIVE PREGNANCY TEST? If you have a positive pregnancy test, schedule an appointment with your health care provider. You might need additional testing to confirm the pregnancy. In the meantime, begin taking a prenatal vitamin, stop smoking, stop drinking alcohol, and do not use street drugs. Talk to your health care provider about how to take care of yourself during your pregnancy. Ask about what to expect from the care you will need throughout pregnancy (prenatal care).   This information is not intended to replace advice given to you by your health care provider. Make sure you discuss any questions you have with your health care provider.   Document Released: 03/01/2003 Document Revised: 03/19/2014 Document Reviewed: 06/23/2013 Elsevier Interactive Patient Education [employer].

## 2014-12-28 NOTE — MAU Provider Note (Signed)
Chief Complaint: Abdominal Pain; Vaginal Bleeding; and Possible Pregnancy   First Provider Initiated Contact with Patient 12/28/14 1825     SUBJECTIVE HPI: Tiffany Blair is a 29 y.o. [id number] female who presents to [facility] Admissions reporting positive home pregnancy test and moderate bleeding and cramping today. Patient is trying to conceive. She was inseminated on 12/14/2014.  Location: Suprapubic Quality: Cramping Severity: 8/10 on pain scale Duration: Less than 24 hours Context: None Timing: Intermittent Modifying factors: None. Hasn't tried any treatments for the pain. Associated signs and symptoms: Negative for fever, chills, nausea, vomiting, diarrhea, constipation, loss of appetite, urinary complaints or vaginal discharge.  Past Medical History  Diagnosis Date  . Medical history non-contributory    OB History  Gravida Para Term Preterm AB SAB TAB Ectopic Multiple Living  2 1 1  1     1     # Outcome Date GA Lbr Len/2nd Weight Sex Delivery Anes PTL Lv  2 Term 02/28/08    F Vag-Spont   Y  1 AB              Past Surgical History  Procedure Laterality Date  . Wisdom tooth extraction     Social History   Social History  . Marital Status: Single    Spouse Name: N/A  . Number of Children: N/A  . Years of Education: N/A   Occupational History  . Not on file.   Social History Main Topics  . Smoking status: Former Research scientist (life sciences)  . Smokeless tobacco: Not on file  . Alcohol Use: Yes     Comment: occ  . Drug Use: No  . Sexual Activity: Yes   Other Topics Concern  . Not on file   Social History Narrative   No current facility-administered medications on file prior to encounter.   Current Outpatient Prescriptions on File Prior to Encounter  Medication Sig Dispense Refill  . acetaminophen (TYLENOL) 500 MG tablet Take 1,000 mg by mouth once. pain    . calcium carbonate (TUMS - DOSED IN MG ELEMENTAL CALCIUM) 500 MG chewable tablet Chew 2 tablets by mouth daily.    .  famotidine (PEPCID) 20 MG tablet Take 20 mg by mouth once.    Marland Kitchen oxyCODONE-acetaminophen (PERCOCET/ROXICET) 5-325 MG per tablet Take 1-2 tablets by mouth every 6 (six) hours as needed. 10 tablet 0   No Known Allergies  I have reviewed the past Medical Hx, Surgical Hx, Social Hx, Allergies and Medications.   Review of Systems  Constitutional: Negative for fever, chills and appetite change.  Gastrointestinal: Positive for abdominal pain. Negative for nausea, vomiting, diarrhea and constipation.  Genitourinary: Positive for vaginal bleeding. Negative for dysuria, urgency, frequency, hematuria, flank pain and vaginal discharge.  Musculoskeletal: Negative for back pain.  Neurological: Negative for dizziness.    OBJECTIVE Patient Vitals for the past 24 hrs:  BP Temp Temp src Pulse Resp Height Weight  12/28/14 1639 (!) 99/51 mmHg 98.4 F (36.9 C) Oral 78 16 5\' 2"  (1.575 m) 142 lb 3.2 oz (64.501 kg)   Constitutional: Well-developed, well-nourished female in no acute distress. No Pallor.  Cardiovascular: normal rate Respiratory: normal rate and effort.  GI: Abd soft, non-tender, gravid appropriate for gestational age. Pos BS x 4 MS: Extremities nontender, no edema, normal ROM Neurologic: Alert and oriented x 4.  GU: Neg CVAT.  SPECULUM EXAM: NEFG, small amount of blood noted, cervix clean  BIMANUAL: cervix closed; uterus normal size, no adnexal tenderness or masses. No CMT.  LAB  RESULTS Results for orders placed or performed during the hospital encounter of 12/28/14 (from the past 24 hour(s))  Urinalysis, Routine w reflex microscopic (not at [facility])     Status: Abnormal   Collection Time: 12/28/14  4:40 PM  Result Value Ref Range   Color, Urine YELLOW YELLOW   APPearance CLEAR CLEAR   Specific Gravity, Urine >1.030 (H) 1.005 - 1.030   pH 6.0 5.0 - 8.0   Glucose, UA NEGATIVE NEGATIVE mg/dL   Hgb urine dipstick MODERATE (A) NEGATIVE   Bilirubin Urine NEGATIVE NEGATIVE   Ketones, ur 15  (A) NEGATIVE mg/dL   Protein, ur NEGATIVE NEGATIVE mg/dL   Urobilinogen, UA 1.0 0.0 - 1.0 mg/dL   Nitrite NEGATIVE NEGATIVE   Leukocytes, UA NEGATIVE NEGATIVE  Urine microscopic-add on     Status: None   Collection Time: 12/28/14  4:40 PM  Result Value Ref Range   Squamous Epithelial / LPF RARE RARE   WBC, UA 0-2 <3 WBC/hpf   RBC / HPF 0-2 <3 RBC/hpf   Bacteria, UA RARE RARE   Urine-Other MUCOUS PRESENT   hCG, quantitative, pregnancy     Status: None   Collection Time: 12/28/14  5:10 PM  Result Value Ref Range   hCG, Beta Chain, Quant, S <1 <5 mIU/mL  CBC     Status: Abnormal   Collection Time: 12/28/14  5:10 PM  Result Value Ref Range   WBC 5.0 4.0 - 10.5 K/uL   RBC 3.87 3.87 - 5.11 MIL/uL   Hemoglobin 10.8 (L) 12.0 - 15.0 g/dL   HCT 33.7 (L) 36.0 - 46.0 %   MCV 87.1 78.0 - 100.0 fL   MCH 27.9 26.0 - 34.0 pg   MCHC 32.0 30.0 - 36.0 g/dL   RDW 13.7 11.5 - 15.5 %   Platelets 234 150 - 400 K/uL    IMAGING No results found.  MAU COURSE UPT neg. Quant, CBC, UA  MDM W/ benign abd exam and absence of leukocytosis or CMT stable VB and abd pain C/W onset on menses. Pt is non-toxic appearing.    ASSESSMENT 1. Dysmenorrhea   2. Negative pregnancy test    PL Discharge home in stable condition. Bleeding precautions Rx Ibuprofen PRN for cramping,   Follow-up Information    Follow up with Gynecologist.   Why:  Routine gynecology care        Medication List    TAKE these medications        acetaminophen 500 MG tablet  Commonly known as:  TYLENOL  Take 1,000 mg by mouth once. pain     calcium carbonate 500 MG chewable tablet  Commonly known as:  TUMS - dosed in mg elemental calcium  Chew 2 tablets by mouth daily.     famotidine 20 MG tablet  Commonly known as:  PEPCID  Take 20 mg by mouth once.     ibuprofen 600 MG tablet  Commonly known as:  ADVIL,MOTRIN  Take 1 tablet (600 mg total) by mouth every 6 (six) hours as needed for cramping.      oxyCODONE-acetaminophen 5-325 MG tablet  Commonly known as:  PERCOCET/ROXICET  Take 1-2 tablets by mouth every 6 (six) hours as needed.        [place], [place] 12/28/2014  6:32 PM

## 2014-12-28 NOTE — MAU Note (Signed)
Did HPT 2 days ago, was +, started bleeding yesterday. Pains come and go

## 2014-12-28 NOTE — MAU Note (Signed)
Pregnancy Test completed result is NEG. Rejected the test due to POS home pregnancy test.

## 2015-02-22 ENCOUNTER — Emergency Department (HOSPITAL_COMMUNITY)
Admission: EM | Admit: 2015-02-22 | Discharge: 2015-02-22 | Discharge status: Against Medical Advice | Payer: Medicaid Other | Attending: Emergency Medicine | Admitting: Emergency Medicine

## 2015-02-22 ENCOUNTER — Encounter (HOSPITAL_COMMUNITY): Payer: Self-pay | Admitting: Emergency Medicine

## 2015-02-22 DIAGNOSIS — Z79899 Other long term (current) drug therapy: Secondary | ICD-10-CM | POA: Insufficient documentation

## 2015-02-22 DIAGNOSIS — R3915 Urgency of urination: Secondary | ICD-10-CM | POA: Diagnosis not present

## 2015-02-22 DIAGNOSIS — M545 Low back pain: Secondary | ICD-10-CM | POA: Insufficient documentation

## 2015-02-22 DIAGNOSIS — Z3202 Encounter for pregnancy test, result negative: Secondary | ICD-10-CM | POA: Diagnosis not present

## 2015-02-22 DIAGNOSIS — Z87891 Personal history of nicotine dependence: Secondary | ICD-10-CM | POA: Insufficient documentation

## 2015-02-22 DIAGNOSIS — R102 Pelvic and perineal pain: Secondary | ICD-10-CM | POA: Diagnosis present

## 2015-02-22 LAB — URINALYSIS, ROUTINE W REFLEX MICROSCOPIC
BILIRUBIN URINE: NEGATIVE
GLUCOSE, UA: NEGATIVE mg/dL
HGB URINE DIPSTICK: NEGATIVE
KETONES UR: NEGATIVE mg/dL
Leukocytes, UA: NEGATIVE
Nitrite: NEGATIVE
PROTEIN: NEGATIVE mg/dL
Specific Gravity, Urine: 1.026 (ref 1.005–1.030)
pH: 6.5 (ref 5.0–8.0)

## 2015-02-22 LAB — COMPREHENSIVE METABOLIC PANEL
ALK PHOS: 53 U/L (ref 38–126)
ALT: 15 U/L (ref 14–54)
AST: 20 U/L (ref 15–41)
Albumin: 3.9 g/dL (ref 3.5–5.0)
Anion gap: 6 (ref 5–15)
BUN: 12 mg/dL (ref 6–20)
CALCIUM: 9.1 mg/dL (ref 8.9–10.3)
CHLORIDE: 105 mmol/L (ref 101–111)
CO2: 27 mmol/L (ref 22–32)
CREATININE: 0.76 mg/dL (ref 0.44–1.00)
GFR calc Af Amer: >60 mL/min (ref >60)
GFR calc non Af Amer: >60 mL/min (ref >60)
Glucose, Bld: 97 mg/dL (ref 65–99)
Potassium: 3.7 mmol/L (ref 3.5–5.1)
SODIUM: 138 mmol/L (ref 135–145)
Total Bilirubin: 0.4 mg/dL (ref 0.3–1.2)
Total Protein: 7.2 g/dL (ref 6.5–8.1)

## 2015-02-22 LAB — CBC
HCT: 35.4 % — ABNORMAL LOW (ref 36.0–46.0)
Hemoglobin: 11 g/dL — ABNORMAL LOW (ref 12.0–15.0)
MCH: 27.4 pg (ref 26.0–34.0)
MCHC: 31.1 g/dL (ref 30.0–36.0)
MCV: 88.3 fL (ref 78.0–100.0)
PLATELETS: 236 10*3/uL (ref 150–400)
RBC: 4.01 MIL/uL (ref 3.87–5.11)
RDW: 13.2 % (ref 11.5–15.5)
WBC: 5.8 10*3/uL (ref 4.0–10.5)

## 2015-02-22 LAB — LIPASE, BLOOD: LIPASE: 28 U/L (ref 11–51)

## 2015-02-22 LAB — POC URINE PREG, ED: Preg Test, Ur: NEGATIVE

## 2015-02-22 NOTE — ED Notes (Signed)
Pt. reports low abdominal pain with nausea onset last week , denies emesis or diarrhea , no dysuria or vaginal discharge , denies fever or chills.

## 2015-02-22 NOTE — ED Notes (Signed)
Patient left AMA with friend.

## 2015-02-22 NOTE — ED Notes (Signed)
Patient left prior to discharge instructions being provided to patient. Patient seen walking out of ED with friend. No acute distress noted.

## 2015-02-22 NOTE — ED Notes (Signed)
Patient refusing pelvic exam. Reports that she will speak with her physician tomorrow regarding pelvic exam.

## 2015-02-22 NOTE — ED Provider Notes (Signed)
CSN: [id number]     Arrival date & time 02/22/15  2009 History   First MD Initiated Contact with Patient 02/22/15 2213     Chief Complaint  Patient presents with  . Abdominal Pain     (Consider location/radiation/quality/duration/timing/severity/associated sxs/prior Treatment) HPI Patient had lower pelvic pain for a month. She reports that also aches in her back. She denies vomiting or diarrhea. His pain burning or urgency his urination. No fever or chills. She reports she had a pelvic exam about a month ago and did not have an STD at that time. Pain is cramping and aching in nature. Past Medical History  Diagnosis Date  . Medical history non-contributory    Past Surgical History  Procedure Laterality Date  . Wisdom tooth extraction     No family history on file. Social History  Substance Use Topics  . Smoking status: Former Research scientist (life sciences)  . Smokeless tobacco: None  . Alcohol Use: Yes     Comment: occ   OB History    Gravida Para Term Preterm AB TAB SAB Ectopic Multiple Living   2 1 1  1     1      Review of Systems 10 Systems reviewed and are negative for acute change except as noted in the HPI.    Allergies  Review of patient's allergies indicates no known allergies.  Home Medications   Prior to Admission medications   Medication Sig Start Date End Date Taking? Authorizing Provider  acetaminophen (TYLENOL) 500 MG tablet Take 1,000 mg by mouth every 8 (eight) hours as needed for mild pain or headache. pain   Yes Historical Provider, MD  calcium carbonate (TUMS - DOSED IN MG ELEMENTAL CALCIUM) 500 MG chewable tablet Chew 2 tablets by mouth daily.   Yes Historical Provider, MD  famotidine (PEPCID) 20 MG tablet Take 20 mg by mouth daily.    Yes Historical Provider, MD   BP 105/62 mmHg  Pulse 62  Temp(Src) 98.9 F (37.2 C) (Oral)  Resp 16  Ht 5\' 2"  (1.575 m)  Wt 153 lb (69.4 kg)  BMI 27.98 kg/m2  SpO2 100%  LMP  (Approximate) Physical Exam  Constitutional: She is  oriented to person, place, and time. She appears well-developed and well-nourished.  HENT:  Head: Normocephalic and atraumatic.  Eyes: EOM are normal. Pupils are equal, round, and reactive to light.  Neck: Neck supple.  Cardiovascular: Normal rate, regular rhythm, normal heart sounds and intact distal pulses.   Pulmonary/Chest: Effort normal and breath sounds normal.  Abdominal: Soft. Bowel sounds are normal. She exhibits no distension. There is no tenderness.  Musculoskeletal: Normal range of motion. She exhibits no edema.  Neurological: She is alert and oriented to person, place, and time. She has normal strength. Coordination normal. GCS eye subscore is 4. GCS verbal subscore is 5. GCS motor subscore is 6.  Skin: Skin is warm, dry and intact.  Psychiatric: She has a normal mood and affect.    ED Course  Procedures (including critical care time) Labs Review Labs Reviewed  CBC - Abnormal; Notable for the following:    Hemoglobin 11.0 (*)    HCT 35.4 (*)    All other components within normal limits  WET PREP, GENITAL  LIPASE, BLOOD  COMPREHENSIVE METABOLIC PANEL  URINALYSIS, ROUTINE W REFLEX MICROSCOPIC (NOT AT [facility])  HIV ANTIBODY (ROUTINE TESTING)  POC URINE PREG, ED  GC/CHLAMYDIA PROBE AMP ([place]) NOT AT [facility]    Imaging Review No results found. I have  personally reviewed and evaluated these images and lab results as part of my medical decision-making.   EKG Interpretation None      MDM   Final diagnoses:  Pelvic pain in female   Patient did express some concern as to whether or not she was pregnant. Advised that she was not pregnant. Due to ongoing low pelvic pain and back pain I advised we will do a pelvic examination. Patient determined she did not want any pelvic exam and stated she would follow-up with her outpatient provider regarding that. She did inform the nurse she did not wish to have a pelvic examination and was ready to leave and walked out without  discharge instructions.    Charlesetta Shanks, MD 02/24/15 [phone]

## 2015-02-23 LAB — HIV ANTIBODY (ROUTINE TESTING W REFLEX): HIV SCREEN 4TH GENERATION: NONREACTIVE

## 2015-06-25 ENCOUNTER — Encounter (HOSPITAL_COMMUNITY): Payer: Self-pay

## 2015-06-25 ENCOUNTER — Emergency Department (HOSPITAL_COMMUNITY)
Admission: EM | Admit: 2015-06-25 | Discharge: 2015-06-25 | Disposition: A | Discharge status: Home / Self Care | Payer: Medicaid Other | Attending: Emergency Medicine | Admitting: Emergency Medicine

## 2015-06-25 ENCOUNTER — Emergency Department (HOSPITAL_COMMUNITY): Payer: Medicaid Other

## 2015-06-25 DIAGNOSIS — R079 Chest pain, unspecified: Secondary | ICD-10-CM | POA: Diagnosis not present

## 2015-06-25 DIAGNOSIS — R112 Nausea with vomiting, unspecified: Secondary | ICD-10-CM | POA: Insufficient documentation

## 2015-06-25 DIAGNOSIS — R1013 Epigastric pain: Secondary | ICD-10-CM | POA: Insufficient documentation

## 2015-06-25 DIAGNOSIS — Z87891 Personal history of nicotine dependence: Secondary | ICD-10-CM | POA: Diagnosis not present

## 2015-06-25 DIAGNOSIS — Z79899 Other long term (current) drug therapy: Secondary | ICD-10-CM | POA: Diagnosis not present

## 2015-06-25 DIAGNOSIS — R1011 Right upper quadrant pain: Secondary | ICD-10-CM | POA: Diagnosis not present

## 2015-06-25 DIAGNOSIS — Z3202 Encounter for pregnancy test, result negative: Secondary | ICD-10-CM | POA: Insufficient documentation

## 2015-06-25 LAB — URINALYSIS, ROUTINE W REFLEX MICROSCOPIC
Glucose, UA: NEGATIVE mg/dL
HGB URINE DIPSTICK: NEGATIVE
Ketones, ur: 15 mg/dL — AB
Leukocytes, UA: NEGATIVE
Nitrite: NEGATIVE
PROTEIN: NEGATIVE mg/dL
Specific Gravity, Urine: 1.03 (ref 1.005–1.030)
pH: 6 (ref 5.0–8.0)

## 2015-06-25 LAB — CBC
HEMATOCRIT: 39 % (ref 36.0–46.0)
HEMOGLOBIN: 12.8 g/dL (ref 12.0–15.0)
MCH: 27.4 pg (ref 26.0–34.0)
MCHC: 32.8 g/dL (ref 30.0–36.0)
MCV: 83.5 fL (ref 78.0–100.0)
PLATELETS: 256 10*3/uL (ref 150–400)
RBC: 4.67 MIL/uL (ref 3.87–5.11)
RDW: 13.8 % (ref 11.5–15.5)
WBC: 7.2 10*3/uL (ref 4.0–10.5)

## 2015-06-25 LAB — BASIC METABOLIC PANEL
ANION GAP: 9 (ref 5–15)
BUN: 11 mg/dL (ref 6–20)
CO2: 24 mmol/L (ref 22–32)
Calcium: 9.2 mg/dL (ref 8.9–10.3)
Chloride: 105 mmol/L (ref 101–111)
Creatinine, Ser: 0.79 mg/dL (ref 0.44–1.00)
GFR calc Af Amer: >60 mL/min (ref >60)
GFR calc non Af Amer: >60 mL/min (ref >60)
GLUCOSE: 101 mg/dL — AB (ref 65–99)
POTASSIUM: 3.9 mmol/L (ref 3.5–5.1)
Sodium: 138 mmol/L (ref 135–145)

## 2015-06-25 LAB — LIPASE, BLOOD: Lipase: 18 U/L (ref 11–51)

## 2015-06-25 LAB — I-STAT TROPONIN, ED: Troponin i, poc: 0 ng/mL (ref 0.00–0.08)

## 2015-06-25 LAB — PREGNANCY, URINE: PREG TEST UR: NEGATIVE

## 2015-06-25 MED ORDER — HYDROCODONE-ACETAMINOPHEN 5-325 MG PO TABS
2.0000 | ORAL_TABLET | Freq: Once | ORAL | Status: AC
  Administered 2015-06-25: 2 via ORAL
  Filled 2015-06-25: qty 2

## 2015-06-25 MED ORDER — GI COCKTAIL ~~LOC~~
30.0000 mL | Freq: Once | ORAL | Status: AC
  Administered 2015-06-25: 30 mL via ORAL
  Filled 2015-06-25: qty 30

## 2015-06-25 MED ORDER — HYDROCODONE-ACETAMINOPHEN 5-325 MG PO TABS: 1.0000 | ORAL_TABLET | Freq: Once | ORAL | Status: DC

## 2015-06-25 NOTE — Discharge Instructions (Signed)
Biliary Colic °Biliary colic is a pain in the upper abdomen. The pain: °· Is usually felt on the right side of the abdomen, but it may also be felt in the center of the abdomen, just below the breastbone (sternum). °· May spread back toward the right shoulder blade. °· May be steady or irregular. °· May be accompanied by nausea and vomiting. °Most of the time, the pain goes away in 1-5 hours. After the most intense pain passes, the abdomen may continue to ache mildly for about 24 hours. °Biliary colic is caused by a blockage in the bile duct. The bile duct is a pathway that carries bile--a liquid that helps to digest fats--from the gallbladder to the small intestine. Biliary colic usually occurs after eating, when the digestive system demands bile. The pain develops when muscle cells contract forcefully to try to move the blockage so that bile can get by. °HOME CARE INSTRUCTIONS °· Take medicines only as directed by your health care provider. °· Drink enough fluid to keep your urine clear or pale yellow. °· Avoid fatty, greasy, and fried foods. These kinds of foods increase your body's demand for bile. °· Avoid any foods that make your pain worse. °· Avoid overeating. °· Avoid having a large meal after fasting. °SEEK MEDICAL CARE IF: °· You develop a fever. °· Your pain gets worse. °· You vomit. °· You develop nausea that prevents you from eating and drinking. °SEEK IMMEDIATE MEDICAL CARE IF: °· You suddenly develop a fever and shaking chills. °· You develop a yellowish discoloration (jaundice) of: °¨ Skin. °¨ Whites of the eyes. °¨ Mucous membranes. °· You have continuous or severe pain that is not relieved with medicines. °· You have nausea and vomiting that is not relieved with medicines. °· You develop dizziness or you faint. °  °This information is not intended to replace advice given to you by your health care provider. Make sure you discuss any questions you have with your health care provider. °  °Document  Released: 07/30/2005 Document Revised: 07/13/2014 Document Reviewed: 12/08/2013 °Elsevier Interactive Patient Education ©2016 [employer] ° °

## 2015-06-25 NOTE — ED Provider Notes (Signed)
CSN: [id number]     Arrival date & time 06/25/15  1340 History   First MD Initiated Contact with Patient 06/25/15 1745     Chief Complaint  Patient presents with  . Chest Pain   HPI Comments: 30 year old female presents with epigastric pain for the past day. She states she is been evaluated for this problem in the past and been told it is her gallbladder. She reports it is worse with eating. Reports associated N/V. Pain is 10/10, dull, intermittent. It radiates to her side. Denies fever, chills, chest pain, SOB, diarrhea.  Patient is a 30 y.o. female presenting with chest pain.  Chest Pain Associated symptoms: abdominal pain, nausea and vomiting   Associated symptoms: no shortness of breath     Past Medical History  Diagnosis Date  . Medical history non-contributory    Past Surgical History  Procedure Laterality Date  . Wisdom tooth extraction     No family history on file. Social History  Substance Use Topics  . Smoking status: Former Research scientist (life sciences)  . Smokeless tobacco: None  . Alcohol Use: Yes     Comment: occ   OB History    Gravida Para Term Preterm AB TAB SAB Ectopic Multiple Living   2 1 1  1     1      Review of Systems  Respiratory: Negative for shortness of breath.   Cardiovascular: Negative for chest pain.  Gastrointestinal: Positive for nausea, vomiting and abdominal pain.      Allergies  Review of patient's allergies indicates no known allergies.  Home Medications   Prior to Admission medications   Medication Sig Start Date End Date Taking? Authorizing Provider  acetaminophen (TYLENOL) 500 MG tablet Take 1,000 mg by mouth every 8 (eight) hours as needed for mild pain or headache. pain    Historical Provider, MD  calcium carbonate (TUMS - DOSED IN MG ELEMENTAL CALCIUM) 500 MG chewable tablet Chew 2 tablets by mouth daily.    Historical Provider, MD  famotidine (PEPCID) 20 MG tablet Take 20 mg by mouth daily.     Historical Provider, MD   BP 106/70 mmHg  Pulse  66  Temp(Src) 98.3 F (36.8 C) (Oral)  Resp 17  SpO2 100%  LMP 05/22/2015 (Approximate)   Physical Exam  Constitutional: She is oriented to person, place, and time. She appears well-developed and well-nourished. No distress.  HENT:  Head: Normocephalic and atraumatic.  Eyes: Conjunctivae are normal. Pupils are equal, round, and reactive to light. Right eye exhibits no discharge. Left eye exhibits no discharge. No scleral icterus.  Neck: Normal range of motion.  Cardiovascular: Normal rate and regular rhythm.  Exam reveals no gallop and no friction rub.   No murmur heard. Pulmonary/Chest: Effort normal. No respiratory distress. She has no wheezes. She has no rales. She exhibits no tenderness.  Abdominal: Soft. Bowel sounds are normal. She exhibits no distension and no mass. There is tenderness. There is no rebound and no guarding.  RUQ and epigastric tenderness  Neurological: She is alert and oriented to person, place, and time.  Skin: Skin is warm and dry.  Psychiatric: She has a normal mood and affect.    ED Course  Procedures (including critical care time) Labs Review Labs Reviewed  BASIC METABOLIC PANEL - Abnormal; Notable for the following:    Glucose, Bld 101 (*)    All other components within normal limits  URINALYSIS, ROUTINE W REFLEX MICROSCOPIC (NOT AT [facility]) - Abnormal; Notable for the following:  Color, Urine AMBER (*)    Bilirubin Urine SMALL (*)    Ketones, ur 15 (*)    All other components within normal limits  CBC  PREGNANCY, URINE  LIPASE, BLOOD  I-STAT TROPOININ, ED    Imaging Review Dg Chest 2 View  06/25/2015  CLINICAL DATA:  30 year old female with central chest pain spreading bilaterally EXAM: CHEST  2 VIEW COMPARISON:  Prior chest x-ray 04/09/2014 FINDINGS: The lungs are clear and negative for focal airspace consolidation, pulmonary edema or suspicious pulmonary nodule. No pleural effusion or pneumothorax. Cardiac and mediastinal contours are within  normal limits. No acute fracture or lytic or blastic osseous lesions. The visualized upper abdominal bowel gas pattern is unremarkable. IMPRESSION: Normal chest x-ray Electronically Signed   By: Jacqulynn Cadet M.D.   On: 06/25/2015 15:09   US Abdomen Limited Ruq  06/25/2015  CLINICAL DATA:  Acute onset of generalized abdominal and back pain for 1 day. Initial encounter. EXAM: US ABDOMEN LIMITED - RIGHT UPPER QUADRANT COMPARISON:  Abdominal ultrasound performed 04/07/2009 FINDINGS: Gallbladder: Stones are noted dependently within the gallbladder. A 5 mm polyp is also noted along the gallbladder wall. No gallbladder wall thickening or pericholecystic fluid is seen. No ultrasonographic Murphy's sign is elicited. Common bile duct: Diameter: 0.7 cm, borderline prominent. Liver: No focal lesion identified. Within normal limits in parenchymal echogenicity. IMPRESSION: 1. Persistent mild distention of the common bile duct, similar in appearance to 2011, which may simply reflect the patient's baseline or could reflect a distal obstructing stone, depending on the patient's symptoms. Would correlate with the patient's presentation and subsequent findings in 2011. MRCP could be considered for further evaluation, when and if deemed clinically appropriate. 2. Cholelithiasis.  No evidence for cholecystitis. 3. 5 mm polyp noted along the gallbladder wall. Electronically Signed   By: Garald Balding M.D.   On: 06/25/2015 19:43   I have personally reviewed and evaluated these images and lab results as part of my medical decision-making.   EKG Interpretation None     Meds given in ED:  Medications  gi cocktail (Maalox,Lidocaine,Donnatal) (30 mLs Oral Given 06/25/15 1819)  HYDROcodone-acetaminophen (NORCO/VICODIN) 5-325 MG per tablet 2 tablet (2 tablets Oral Given 06/25/15 2022)    Discharge Medication List as of 06/25/2015 10:50 PM    START taking these medications   Details  HYDROcodone-acetaminophen  (NORCO/VICODIN) 5-325 MG tablet Take 1 tablet by mouth once., Starting 06/25/2015, Print         MDM   Final diagnoses:  RUQ pain   30 year old female presents with acute on chronic RUQ and epigastric pain. Her symptoms are most consistent with biliary colic. RUQ [place] was obtained which showed gallstones without evidence of cholecystitis and mild distention of the common bile duct. Recommended outpatient follow up with GI. GI cocktail given and pain medicine with some relief. Labs are unremarkable. CXR was unremarkable. EKG was reassuring.  Patient is non-toxic, NAD with stable VS. Return precautions given.  Recardo Evangelist, PA-C 06/27/15 [id number]  Varney Biles, MD 06/28/15 [phone]

## 2015-06-25 NOTE — ED Notes (Signed)
Undo discharge completed on this pt. Pt reported that she was in the bathroom when she was called by nursing staff.

## 2015-06-25 NOTE — ED Notes (Signed)
Pt presents with c/o central chest pain that radiates to her back. Pt reports the pain started last night, reports a hx of the same type of pain. Pt denies any shortness of breath. Reports some nausea and vomiting with the pain.

## 2015-06-28 ENCOUNTER — Inpatient Hospital Stay (HOSPITAL_COMMUNITY)
Admission: EM | Admit: 2015-06-28 | Discharge: 2015-07-02 | DRG: 419 | Disposition: A | Discharge status: Home / Self Care | Payer: Medicaid Other | Attending: Internal Medicine | Admitting: Internal Medicine

## 2015-06-28 ENCOUNTER — Encounter (HOSPITAL_COMMUNITY): Payer: Self-pay

## 2015-06-28 DIAGNOSIS — K8050 Calculus of bile duct without cholangitis or cholecystitis without obstruction: Secondary | ICD-10-CM

## 2015-06-28 DIAGNOSIS — Z87891 Personal history of nicotine dependence: Secondary | ICD-10-CM

## 2015-06-28 DIAGNOSIS — D649 Anemia, unspecified: Secondary | ICD-10-CM

## 2015-06-28 DIAGNOSIS — Z419 Encounter for procedure for purposes other than remedying health state, unspecified: Secondary | ICD-10-CM

## 2015-06-28 DIAGNOSIS — R1011 Right upper quadrant pain: Secondary | ICD-10-CM

## 2015-06-28 DIAGNOSIS — K8071 Calculus of gallbladder and bile duct without cholecystitis with obstruction: Principal | ICD-10-CM

## 2015-06-28 DIAGNOSIS — R740 Nonspecific elevation of levels of transaminase and lactic acid dehydrogenase [LDH]: Secondary | ICD-10-CM | POA: Diagnosis present

## 2015-06-28 DIAGNOSIS — J029 Acute pharyngitis, unspecified: Secondary | ICD-10-CM | POA: Diagnosis not present

## 2015-06-28 DIAGNOSIS — K219 Gastro-esophageal reflux disease without esophagitis: Secondary | ICD-10-CM | POA: Diagnosis present

## 2015-06-28 DIAGNOSIS — R7401 Elevation of levels of liver transaminase levels: Secondary | ICD-10-CM

## 2015-06-28 HISTORY — DX: Encounter for supervision of normal pregnancy, unspecified, unspecified trimester: Z34.90

## 2015-06-28 NOTE — ED Notes (Signed)
According to EMS, pt states she was seen here recently and d/c'd w/ gallstones. Pt states she was never Rx'd pain medication and that is why she primarily notified EMS this PM. Pt administered 177mcg of Fentanyl by EMS pain decreased to 6/10 from 10/10. Pt arrives A+OX4, pt states she was seen by her PCP today and was told she has a infection secondary to her gallstones. Pt states "I basically want my gallstones to be taken out so the pain will stop".

## 2015-06-28 NOTE — ED Notes (Signed)
Bed: [facility] Expected date:  Expected time:  Means of arrival:  Comments: 30 yo F  Gallstones

## 2015-06-29 ENCOUNTER — Encounter (HOSPITAL_COMMUNITY): Payer: Self-pay | Admitting: Internal Medicine

## 2015-06-29 ENCOUNTER — Emergency Department (HOSPITAL_COMMUNITY): Payer: Medicaid Other

## 2015-06-29 DIAGNOSIS — R740 Nonspecific elevation of levels of transaminase and lactic acid dehydrogenase [LDH]: Secondary | ICD-10-CM

## 2015-06-29 DIAGNOSIS — K8050 Calculus of bile duct without cholangitis or cholecystitis without obstruction: Secondary | ICD-10-CM | POA: Diagnosis not present

## 2015-06-29 DIAGNOSIS — K8071 Calculus of gallbladder and bile duct without cholecystitis with obstruction: Secondary | ICD-10-CM

## 2015-06-29 DIAGNOSIS — K219 Gastro-esophageal reflux disease without esophagitis: Secondary | ICD-10-CM | POA: Diagnosis present

## 2015-06-29 DIAGNOSIS — Z87891 Personal history of nicotine dependence: Secondary | ICD-10-CM | POA: Diagnosis not present

## 2015-06-29 DIAGNOSIS — J029 Acute pharyngitis, unspecified: Secondary | ICD-10-CM | POA: Diagnosis not present

## 2015-06-29 DIAGNOSIS — D649 Anemia, unspecified: Secondary | ICD-10-CM | POA: Diagnosis not present

## 2015-06-29 DIAGNOSIS — R1011 Right upper quadrant pain: Secondary | ICD-10-CM

## 2015-06-29 DIAGNOSIS — R7401 Elevation of levels of liver transaminase levels: Secondary | ICD-10-CM

## 2015-06-29 LAB — COMPREHENSIVE METABOLIC PANEL
ALT: 293 U/L — ABNORMAL HIGH (ref 14–54)
ANION GAP: 9 (ref 5–15)
AST: 415 U/L — AB (ref 15–41)
Albumin: 4.1 g/dL (ref 3.5–5.0)
Alkaline Phosphatase: 117 U/L (ref 38–126)
BILIRUBIN TOTAL: 1.9 mg/dL — AB (ref 0.3–1.2)
BUN: 12 mg/dL (ref 6–20)
CO2: 27 mmol/L (ref 22–32)
Calcium: 8.9 mg/dL (ref 8.9–10.3)
Chloride: 103 mmol/L (ref 101–111)
Creatinine, Ser: 0.71 mg/dL (ref 0.44–1.00)
GFR calc Af Amer: >60 mL/min (ref >60)
Glucose, Bld: 109 mg/dL — ABNORMAL HIGH (ref 65–99)
POTASSIUM: 4.2 mmol/L (ref 3.5–5.1)
Sodium: 139 mmol/L (ref 135–145)
TOTAL PROTEIN: 7.4 g/dL (ref 6.5–8.1)

## 2015-06-29 LAB — LIPASE, BLOOD: Lipase: 19 U/L (ref 11–51)

## 2015-06-29 LAB — DIFFERENTIAL
BASOS ABS: 0 10*3/uL (ref 0.0–0.1)
BASOS PCT: 0 %
Eosinophils Absolute: 0.1 10*3/uL (ref 0.0–0.7)
Eosinophils Relative: 1 %
LYMPHS PCT: 16 %
Lymphs Abs: 1.1 10*3/uL (ref 0.7–4.0)
MONO ABS: 0.6 10*3/uL (ref 0.1–1.0)
Monocytes Relative: 9 %
NEUTROS ABS: 5.1 10*3/uL (ref 1.7–7.7)
NEUTROS PCT: 74 %

## 2015-06-29 LAB — URINALYSIS, ROUTINE W REFLEX MICROSCOPIC
Glucose, UA: NEGATIVE mg/dL
Glucose, UA: NEGATIVE mg/dL
Hgb urine dipstick: NEGATIVE
KETONES UR: NEGATIVE mg/dL
Ketones, ur: NEGATIVE mg/dL
Leukocytes, UA: NEGATIVE
NITRITE: POSITIVE — AB
NITRITE: NEGATIVE
PH: 6 (ref 5.0–8.0)
PROTEIN: NEGATIVE mg/dL
PROTEIN: NEGATIVE mg/dL
Specific Gravity, Urine: 1.034 — ABNORMAL HIGH (ref 1.005–1.030)
Specific Gravity, Urine: 1.029 (ref 1.005–1.030)
pH: 6 (ref 5.0–8.0)

## 2015-06-29 LAB — URINE MICROSCOPIC-ADD ON: RBC / HPF: NONE SEEN RBC/hpf (ref 0–5)

## 2015-06-29 LAB — CBC
HEMATOCRIT: 35.5 % — AB (ref 36.0–46.0)
Hemoglobin: 11.7 g/dL — ABNORMAL LOW (ref 12.0–15.0)
MCH: 28.3 pg (ref 26.0–34.0)
MCHC: 33 g/dL (ref 30.0–36.0)
MCV: 86 fL (ref 78.0–100.0)
Platelets: 252 10*3/uL (ref 150–400)
RBC: 4.13 MIL/uL (ref 3.87–5.11)
RDW: 13.8 % (ref 11.5–15.5)
WBC: 6.8 10*3/uL (ref 4.0–10.5)

## 2015-06-29 LAB — I-STAT BETA HCG BLOOD, ED (MC, WL, AP ONLY)

## 2015-06-29 MED ORDER — ONDANSETRON HCL 4 MG/2ML IJ SOLN
4.0000 mg | Freq: Three times a day (TID) | INTRAMUSCULAR | Status: AC | PRN
  Administered 2015-06-29 (×2): 4 mg via INTRAVENOUS
  Filled 2015-06-29 (×2): qty 2

## 2015-06-29 MED ORDER — HYDROMORPHONE HCL 1 MG/ML IJ SOLN
1.0000 mg | Freq: Once | INTRAMUSCULAR | Status: DC
  Filled 2015-06-29: qty 1

## 2015-06-29 MED ORDER — SODIUM CHLORIDE 0.9 % IV SOLN: INTRAVENOUS | Status: AC

## 2015-06-29 MED ORDER — DEXTROSE 5 % IV SOLN
1.0000 g | Freq: Once | INTRAVENOUS | Status: AC
  Administered 2015-06-30: 1 g via INTRAVENOUS
  Filled 2015-06-29: qty 10

## 2015-06-29 MED ORDER — SODIUM CHLORIDE 0.9 % IV SOLN
INTRAVENOUS | Status: AC
  Administered 2015-06-29 (×2): via INTRAVENOUS

## 2015-06-29 MED ORDER — HEPARIN SODIUM (PORCINE) 5000 UNIT/ML IJ SOLN
5000.0000 [IU] | Freq: Three times a day (TID) | INTRAMUSCULAR | Status: DC
  Administered 2015-06-29: 5000 [IU] via SUBCUTANEOUS
  Filled 2015-06-29 (×9): qty 1

## 2015-06-29 MED ORDER — HYDROMORPHONE HCL 1 MG/ML IJ SOLN
1.0000 mg | INTRAMUSCULAR | Status: AC | PRN
  Administered 2015-06-29 (×4): 1 mg via INTRAVENOUS
  Filled 2015-06-29 (×4): qty 1

## 2015-06-29 MED ORDER — ONDANSETRON HCL 4 MG/2ML IJ SOLN
4.0000 mg | Freq: Once | INTRAMUSCULAR | Status: DC
  Filled 2015-06-29: qty 2

## 2015-06-29 NOTE — Care Management Note (Signed)
Case Management Note  Patient Details  Name: Tiffany Blair MRN: [id number] Date of Birth: May 23, 1985  Subjective/Objective:           abd pain with nausea and vomiting unknown etiology         Action/Plan:Date:  June 29, 2015 Chart reviewed for concurrent status and case management needs. Will continue to follow patient for changes and needs: Velva Harman, BSN, RN, [place]   [phone]   Expected Discharge Date:                  Expected Discharge Plan:  Home/Self Care  In-House Referral:  NA  Discharge planning Services  CM Consult  Post Acute Care Choice:  NA Choice offered to:  NA  DME Arranged:    DME Agency:     HH Arranged:    [place] Agency:     Status of Service:  Completed, signed off  Medicare Important Message Given:    Date Medicare IM Given:    Medicare IM give by:    Date Additional Medicare IM Given:    Additional Medicare Important Message give by:     If discussed at [place] of Stay Meetings, dates discussed:    Additional Comments:  Leeroy Cha, RN 06/29/2015, 10:35 AM

## 2015-06-29 NOTE — Consult Note (Signed)
Referring Provider:   Dr. Gilles Chiquito Primary Care Physician:  [facility] Primary Gastroenterologist:  None (unassigned)  Reason for Consultation:  Choledocholithiasis  HPI: Tiffany Blair is a 30 y.o. female admitted through the emergency room this morning following a 5 day prodrome of persistent upper abdominal pain, seen in the emergency room over the weekend with normal liver chemistries at that time although on recheck today, they were significantly elevated, with transaminases in the 300-400 range, total bilirubin 1.9, lipase normal, white count normal.   Abdominal ultrasound today shows multiple small gallstones, a stone in the common bile duct, and biliary ductal dilatation.  The patient's pain has been in the epigastric area and right upper quadrant. She has not had fevers or shaking chills but has had nausea and some limited vomiting.   Past Medical History  Diagnosis Date  . Medical history non-contributory   . Pregnancy     Past Surgical History  Procedure Laterality Date  . Wisdom tooth extraction      Prior to Admission medications   Medication Sig Start Date End Date Taking? Authorizing Provider  acetaminophen (TYLENOL) 500 MG tablet Take 1,000 mg by mouth every 8 (eight) hours as needed for mild pain or headache. pain   Yes Historical Provider, MD    Current Facility-Administered Medications  Medication Dose Route Frequency Provider Last Rate Last Dose  . 0.9 %  sodium chloride infusion   Intravenous STAT Shanon Rosser, MD 125 mL/hr at 06/29/15 0926    . [START ON 06/30/2015] cefTRIAXone (ROCEPHIN) 1 g in dextrose 5 % 50 mL IVPB  1 g Intravenous Once Ronald Lobo, MD      . heparin injection 5,000 Units  5,000 Units Subcutaneous Q8H Sid Falcon, MD   5,000 Units at 06/29/15 0915  . HYDROmorphone (DILAUDID) injection 1 mg  1 mg Intravenous Once Shanon Rosser, MD   Stopped at 06/29/15 0456  . HYDROmorphone (DILAUDID) injection 1 mg  1 mg Intravenous Q4H  PRN Shanon Rosser, MD   1 mg at 06/29/15 1204  . ondansetron (ZOFRAN) injection 4 mg  4 mg Intravenous Once Shanon Rosser, MD   Stopped at 06/29/15 0456  . ondansetron (ZOFRAN) injection 4 mg  4 mg Intravenous Q8H PRN Shanon Rosser, MD   4 mg at 06/29/15 0757    Allergies as of 06/28/2015  . (No Known Allergies)    Family History  Problem Relation Age of Onset  . Colon cancer Neg Hx   . Cholelithiasis Neg Hx   . Diabetes Neg Hx   . Hypertension Neg Hx     Social History   Social History  . Marital Status: Single    Spouse Name: N/A  . Number of Children: N/A  . Years of Education: N/A   Occupational History  . Not on file.   Social History Main Topics  . Smoking status: Former Research scientist (life sciences)  . Smokeless tobacco: Not on file  . Alcohol Use: Yes     Comment: occ  . Drug Use: No  . Sexual Activity: Yes   Other Topics Concern  . Not on file   Social History Narrative    Review of Systems: Occasional reflux, otherwise globally negative. No history of anxiety or depression, paresthesias or focal weakness, chronic cough or shortness of breath, chest pain, skin problems, enlarged lymph nodes, or urinary symptoms.  Physical Exam: Vital signs in last 24 hours: Temp:  [98.4 F (36.9 C)] 98.4 F (36.9 C) (04/19 0820)  Pulse Rate:  [49-68] 59 (04/19 0820) Resp:  [15-18] 18 (04/19 0820) BP: (92-107)/(51-75) 92/54 mmHg (04/19 0820) SpO2:  [96 %-100 %] 100 % (04/19 0820) Weight:  [61.3 kg (135 lb 2.3 oz)] 61.3 kg (135 lb 2.3 oz) (04/19 0925) Last BM Date: 06/28/15 General:   Alert,  Well-developed, well-nourished, pleasant and cooperative young African-American female in NAD Head:  Normocephalic and atraumatic. Eyes:  Sclera clear, no icterus.   Conjunctiva pink. Mouth:   No ulcerations or lesions.  Oropharynx pink & moist. Neck:   No masses or thyromegaly. Lungs:  Clear throughout to auscultation.   No wheezes, crackles, or rhonchi. No evident respiratory distress. Heart:   Regular  rate and rhythm; no murmurs, clicks, rubs,  or gallops. Abdomen:  Soft, nontender, nontympanitic, and nondistended. No masses, hepatosplenomegaly or ventral hernias noted. Normal bowel sounds, without bruits, guarding, or rebound.  The patient has had pain medication, and at present, there is no significant or exquisite right upper quadrant or epigastric tenderness. Msk:   Symmetrical without gross deformities. Extremities:   Without clubbing, cyanosis, or edema. Neurologic:  Alert and coherent;  grossly normal neurologically. Skin:  Intact without significant lesions or rashes. Does have several tattoos present. Cervical Nodes:  No significant cervical adenopathy. Psych:   Alert and cooperative. Normal mood and affect, although has a soft voice and is rather quiet.  Intake/Output from previous day:   Intake/Output this shift:    Lab Results:  Recent Labs  06/29/15 0326  WBC 6.8  HGB 11.7*  HCT 35.5*  PLT 252   BMET  Recent Labs  06/29/15 0326  NA 139  K 4.2  CL 103  CO2 27  GLUCOSE 109*  BUN 12  CREATININE 0.71  CALCIUM 8.9   LFT  Recent Labs  06/29/15 0326  PROT 7.4  ALBUMIN 4.1  AST 415*  ALT 293*  ALKPHOS 117  BILITOT 1.9*   PT/INR No results for input(s): LABPROT, INR in the last 72 hours.  Studies/Results: US Abdomen Limited Ruq  06/29/2015  CLINICAL DATA:  Right upper quadrant pain starting last night. Known gallstones. EXAM: US ABDOMEN LIMITED - RIGHT UPPER QUADRANT COMPARISON:  06/25/2015 FINDINGS: Gallbladder: Multiple small stones layering in the dependent portion of the gallbladder. Largest stones measure about 6 mm diameter. No gallbladder wall thickening. Murphy's sign is negative. Small polyp is again demonstrated along the nondependent surface measuring 5 mm consistent with benign lesion. Common bile duct: Diameter: 10 mm, mildly dilated. A stone is demonstrated in the mid/distal common bile duct measuring 6 mm in diameter. Liver: Mild  intrahepatic bile duct dilatation is present. No focal liver lesions. Parenchymal echotexture is homogeneous. IMPRESSION: Cholelithiasis. No additional changes to suggest cholecystitis. Choledocholithiasis with mild dilatation of intra and extrahepatic bile ducts. Electronically Signed   By: Lucienne Capers M.D.   On: 06/29/2015 06:19    Impression: 1. Choledocholithiasis with biliary obstruction but no fever, leukocytosis, or current right upper quadrant tenderness to suggest acute cholangitis. 2. Cholelithiasis  Plan: 1. ERCP with probable sphincterotomy and stone extraction tomorrow morning. The nature, purpose, risks, and alternatives of this procedure were discussed carefully with the patient with the aid of a diagram. Risks discussed included rare mortality, pancreatitis which can be occasionally severe or even fatal, perforation, bleeding, need for emergent corrective surgery, infection, or anesthesia problems such as heart or lung issues. The patient also understands that this procedure is not always successful in achieving removal of her common duct stone, although  it usually is. She is agreeable to proceed.  2. I would recommend general surgical consultation (spoke with Dr. Daryll Drown, who plans to arrange this), since the patient should have cholecystectomy in the near future, probably even this admission.   LOS: 0 days   Shalah Estelle V  06/29/2015, 1:01 PM   Pager [phone] If no answer or after 5 PM call [phone]

## 2015-06-29 NOTE — Consult Note (Signed)
Reason for Consult:  Choledocholithiasis  Referring Physician: Dr. Ezzie Dural  Tiffany Blair is an 30 y.o. female.  HPI: Pt seen on 06/25/15 for epigastric pain and found to have gallstones.  Pain was relieved int he ED and she was sent home for outpatient follow up.   She returned via EMS on 06/28/15, she had been seen by her PCP and told she had an infection of her gallbladder.  Pain became worse, 10/10,  and she presented to the Ed again.  Work up in the ED yesterday shows she is afebrile and VSS.  Bilirubin is up to 1.9, AST 415, ALT 293.  Repeat US last AM shows:  Multiple small stones layering in the dependent portion of the gallbladder. Largest stones measure about 6 mm diameter. No gallbladder wall thickening. Murphy's sign is negative. Small polyp is again demonstrated along the nondependent surface measuring 5 mm consistent with benign lesion. Common bile duct:  Diameter: 10 mm, mildly dilated. A stone is demonstrated in the mid/distal common bile duct measuring 6 mm in diameter.  She has been seen by GI and is scheduled for ERCP in the AM.  We are ask to see.  Past Medical History  Diagnosis Date  . Medical history non-contributory   . Pregnancy     Past Surgical History  Procedure Laterality Date  . Wisdom tooth extraction      Family History  Problem Relation Age of Onset  . Colon cancer Neg Hx   . Cholelithiasis Neg Hx   . Diabetes Neg Hx   . Hypertension Neg Hx     Social History:  reports that she has quit smoking. She does not have any smokeless tobacco history on file. She reports that she drinks alcohol. She reports that she does not use illicit drugs. Tobacco:  None ETOH: none DRUGS: none   Allergies: No Known Allergies  Medications:  Prior to Admission:  Prescriptions prior to admission  Medication Sig Dispense Refill Last Dose  . acetaminophen (TYLENOL) 500 MG tablet Take 1,000 mg by mouth every 8 (eight) hours as needed for mild pain or headache. pain    06/28/2015 at Unknown time   Scheduled: . sodium chloride   Intravenous STAT  . [START ON 06/30/2015] cefTRIAXone (ROCEPHIN)  IV  1 g Intravenous Once  . heparin  5,000 Units Subcutaneous Q8H  .  HYDROmorphone (DILAUDID) injection  1 mg Intravenous Once  . ondansetron (ZOFRAN) IV  4 mg Intravenous Once   Continuous:  FUX:NATFTDDUKGURK (DILAUDID) injection, ondansetron (ZOFRAN) IV Anti-infectives    Start     Dose/Rate Route Frequency Ordered Stop   06/30/15 0600  cefTRIAXone (ROCEPHIN) 1 g in dextrose 5 % 50 mL IVPB     1 g 100 mL/hr over 30 Minutes Intravenous  Once 06/29/15 1301        Results for orders placed or performed during the hospital encounter of 06/28/15 (from the past 48 hour(s))  Lipase, blood     Status: None   Collection Time: 06/29/15  3:26 AM  Result Value Ref Range   Lipase 19 11 - 51 U/L  Comprehensive metabolic panel     Status: Abnormal   Collection Time: 06/29/15  3:26 AM  Result Value Ref Range   Sodium 139 135 - 145 mmol/L   Potassium 4.2 3.5 - 5.1 mmol/L   Chloride 103 101 - 111 mmol/L   CO2 27 22 - 32 mmol/L   Glucose, Bld 109 (H) 65 - 99  mg/dL   BUN 12 6 - 20 mg/dL   Creatinine, Ser 0.71 0.44 - 1.00 mg/dL   Calcium 8.9 8.9 - 10.3 mg/dL   Total Protein 7.4 6.5 - 8.1 g/dL   Albumin 4.1 3.5 - 5.0 g/dL   AST 415 (H) 15 - 41 U/L   ALT 293 (H) 14 - 54 U/L   Alkaline Phosphatase 117 38 - 126 U/L   Total Bilirubin 1.9 (H) 0.3 - 1.2 mg/dL   GFR calc non Af Amer >60 >60 mL/min   GFR calc Af Amer >60 >60 mL/min    Comment: (NOTE) The eGFR has been calculated using the CKD EPI equation. This calculation has not been validated in all clinical situations. eGFR's persistently <60 mL/min signify possible Chronic Kidney Disease.    Anion gap 9 5 - 15  CBC     Status: Abnormal   Collection Time: 06/29/15  3:26 AM  Result Value Ref Range   WBC 6.8 4.0 - 10.5 K/uL   RBC 4.13 3.87 - 5.11 MIL/uL   Hemoglobin 11.7 (L) 12.0 - 15.0 g/dL   HCT 35.5 (L) 36.0 -  46.0 %   MCV 86.0 78.0 - 100.0 fL   MCH 28.3 26.0 - 34.0 pg   MCHC 33.0 30.0 - 36.0 g/dL   RDW 13.8 11.5 - 15.5 %   Platelets 252 150 - 400 K/uL  Differential     Status: None   Collection Time: 06/29/15  3:26 AM  Result Value Ref Range   Neutrophils Relative % 74 %   Neutro Abs 5.1 1.7 - 7.7 K/uL   Lymphocytes Relative 16 %   Lymphs Abs 1.1 0.7 - 4.0 K/uL   Monocytes Relative 9 %   Monocytes Absolute 0.6 0.1 - 1.0 K/uL   Eosinophils Relative 1 %   Eosinophils Absolute 0.1 0.0 - 0.7 K/uL   Basophils Relative 0 %   Basophils Absolute 0.0 0.0 - 0.1 K/uL  I-Stat beta hCG blood, ED ([facility], [facility], [facility] only)     Status: None   Collection Time: 06/29/15  3:32 AM  Result Value Ref Range   I-stat hCG, quantitative <5.0 <5 mIU/mL   Comment 3            Comment:   GEST. AGE      CONC.  (mIU/mL)   <=1 WEEK        5 - 50     2 WEEKS       50 - 500     3 WEEKS       100 - 10,000     4 WEEKS     1,000 - 30,000        FEMALE AND NON-PREGNANT FEMALE:     LESS THAN 5 mIU/mL   Urinalysis, Routine w reflex microscopic (not at [facility])     Status: Abnormal   Collection Time: 06/29/15  4:25 AM  Result Value Ref Range   Color, Urine ORANGE (A) YELLOW    Comment: BIOCHEMICALS MAY BE AFFECTED BY COLOR   APPearance CLOUDY (A) CLEAR   Specific Gravity, Urine 1.034 (H) 1.005 - 1.030   pH 6.0 5.0 - 8.0   Glucose, UA NEGATIVE NEGATIVE mg/dL   Hgb urine dipstick NEGATIVE NEGATIVE   Bilirubin Urine MODERATE (A) NEGATIVE   Ketones, ur NEGATIVE NEGATIVE mg/dL   Protein, ur NEGATIVE NEGATIVE mg/dL   Nitrite POSITIVE (A) NEGATIVE   Leukocytes, UA SMALL (A) NEGATIVE  Urine microscopic-add on  Status: Abnormal   Collection Time: 06/29/15  4:25 AM  Result Value Ref Range   Squamous Epithelial / LPF 0-5 (A) NONE SEEN   WBC, UA 0-5 0 - 5 WBC/hpf   RBC / HPF NONE SEEN 0 - 5 RBC/hpf   Bacteria, UA FEW (A) NONE SEEN   Urine-Other MUCOUS PRESENT   Urinalysis, Routine w reflex microscopic (not at [facility])     Status:  Abnormal   Collection Time: 06/29/15  9:26 AM  Result Value Ref Range   Color, Urine ORANGE (A) YELLOW    Comment: BIOCHEMICALS MAY BE AFFECTED BY COLOR   APPearance CLOUDY (A) CLEAR   Specific Gravity, Urine 1.029 1.005 - 1.030   pH 6.0 5.0 - 8.0   Glucose, UA NEGATIVE NEGATIVE mg/dL   Hgb urine dipstick SMALL (A) NEGATIVE   Bilirubin Urine SMALL (A) NEGATIVE   Ketones, ur NEGATIVE NEGATIVE mg/dL   Protein, ur NEGATIVE NEGATIVE mg/dL   Nitrite NEGATIVE NEGATIVE   Leukocytes, UA NEGATIVE NEGATIVE  Urine microscopic-add on     Status: Abnormal   Collection Time: 06/29/15  9:26 AM  Result Value Ref Range   Squamous Epithelial / LPF 6-30 (A) NONE SEEN   WBC, UA 6-30 0 - 5 WBC/hpf   RBC / HPF 0-5 0 - 5 RBC/hpf   Bacteria, UA FEW (A) NONE SEEN   Urine-Other MUCOUS PRESENT     US Abdomen Limited Ruq  06/29/2015  CLINICAL DATA:  Right upper quadrant pain starting last night. Known gallstones. EXAM: US ABDOMEN LIMITED - RIGHT UPPER QUADRANT COMPARISON:  06/25/2015 FINDINGS: Gallbladder: Multiple small stones layering in the dependent portion of the gallbladder. Largest stones measure about 6 mm diameter. No gallbladder wall thickening. Murphy's sign is negative. Small polyp is again demonstrated along the nondependent surface measuring 5 mm consistent with benign lesion. Common bile duct: Diameter: 10 mm, mildly dilated. A stone is demonstrated in the mid/distal common bile duct measuring 6 mm in diameter. Liver: Mild intrahepatic bile duct dilatation is present. No focal liver lesions. Parenchymal echotexture is homogeneous. IMPRESSION: Cholelithiasis. No additional changes to suggest cholecystitis. Choledocholithiasis with mild dilatation of intra and extrahepatic bile ducts. Electronically Signed   By: Lucienne Capers M.D.   On: 06/29/2015 06:19    Review of Systems  Constitutional: Negative.   HENT: Negative.   Eyes: Negative.   Respiratory: Negative.   Cardiovascular: Negative.    Gastrointestinal: Positive for heartburn, nausea, vomiting, abdominal pain and constipation.  Genitourinary: Negative.   Musculoskeletal: Negative.   Skin: Negative.   Neurological: Negative.   Endo/Heme/Allergies: Negative.   Psychiatric/Behavioral: Negative.    Blood pressure 99/63, pulse 55, temperature 97.7 F (36.5 C), temperature source Axillary, resp. rate 18, height '5\' 2"'  (1.575 m), weight 61.3 kg (135 lb 2.3 oz), last menstrual period 05/22/2015, SpO2 100 %. Physical Exam  Constitutional: She is oriented to person, place, and time. She appears well-developed and well-nourished. No distress.  HENT:  Head: Normocephalic and atraumatic.  Nose: Nose normal.  Eyes: Conjunctivae and EOM are normal. Right eye exhibits no discharge. Left eye exhibits no discharge. No scleral icterus.  Neck: Normal range of motion. Neck supple. No JVD present. No tracheal deviation present. No thyromegaly present.  Cardiovascular: Normal rate, regular rhythm, normal heart sounds and intact distal pulses.   No murmur heard. Respiratory: Effort normal and breath sounds normal. No respiratory distress. She has no wheezes. She has no rales. She exhibits no tenderness.  GI: Bowel sounds are  normal. She exhibits no distension. There is tenderness (mostly RUQ).  Musculoskeletal: She exhibits no edema or tenderness.  Lymphadenopathy:    She has no cervical adenopathy.  Neurological: She is alert and oriented to person, place, and time. No cranial nerve deficit.  Skin: Skin is warm and dry. No rash noted. She is not diaphoretic. No erythema. No pallor.  Psychiatric: She has a normal mood and affect. Her behavior is normal. Judgment and thought content normal.    Assessment/Plan: Choledocholithiasis Elevated LFT's  Plan:  She is scheduled for ERCP tomorrow.  We will recheck labs after ERCP and plan on cholecystectomy when her LFT's are better.   Ruffus Kamaka 06/29/2015, 3:07 PM

## 2015-06-29 NOTE — ED Provider Notes (Signed)
CSN: [id number]     Arrival date & time 06/28/15  2340 History   First MD Initiated Contact with Patient 06/29/15 0421     Chief Complaint  Patient presents with  . Abdominal Pain     (Consider location/radiation/quality/duration/timing/severity/associated sxs/prior Treatment) HPI  This is a 30 year old female with a history of gallstones. She was seen in the ED 5 days ago and had an ultrasound which showed cholelithiasis but not cholecystitis. She was discharged on Norco and referred to gastroenterology. She followed up with her PCP yesterday and had "some sort of study" which showed "that I have an infection in my gallbladder". The pain had been colicky until yesterday evening about 9 PM when the pain became constant. It was severe on arrival but is somewhat improved although it is still present. It is been accompanied by nausea and vomiting but no diarrhea. She is not aware of having a fever. Laboratory studies ordered per protocol show elevated LFTs; there were no LFTs ordered on her previous visit for comparison.  Past Medical History  Diagnosis Date  . Medical history non-contributory    Past Surgical History  Procedure Laterality Date  . Wisdom tooth extraction     History reviewed. No pertinent family history. Social History  Substance Use Topics  . Smoking status: Former Research scientist (life sciences)  . Smokeless tobacco: None  . Alcohol Use: Yes     Comment: occ   OB History    Gravida Para Term Preterm AB TAB SAB Ectopic Multiple Living   2 1 1  1     1      Review of Systems  All other systems reviewed and are negative.   Allergies  Review of patient's allergies indicates no known allergies.  Home Medications   Prior to Admission medications   Medication Sig Start Date End Date Taking? Authorizing Provider  acetaminophen (TYLENOL) 500 MG tablet Take 1,000 mg by mouth every 8 (eight) hours as needed for mild pain or headache. pain   Yes Historical Provider, MD   HYDROcodone-acetaminophen (NORCO/VICODIN) 5-325 MG tablet Take 1 tablet by mouth once. Patient not taking: Reported on 06/29/2015 06/25/15   Recardo Evangelist, PA-C   BP 94/55 mmHg  Pulse 55  Temp(Src) 98.4 F (36.9 C) (Oral)  Resp 15  SpO2 100%  LMP 05/22/2015 (Approximate)   Physical Exam  General: Well-developed, well-nourished female in no acute distress; appearance consistent with age of record HENT: normocephalic; atraumatic Eyes: pupils equal, round and reactive to light; extraocular muscles intact Neck: supple Heart: regular rate and rhythm Lungs: clear to auscultation bilaterally Abdomen: soft; nondistended; right upper quadrant tenderness; no masses or hepatosplenomegaly; bowel sounds present Extremities: No deformity; full range of motion; pulses normal Neurologic: Awake, alert and oriented; motor function intact in all extremities and symmetric; no facial droop Skin: Warm and dry Psychiatric: Normal mood and affect    ED Course  Procedures (including critical care time)   MDM   Nursing notes and vitals signs, including pulse oximetry, reviewed.  Summary of this visit's results, reviewed by myself:  Labs:  Results for orders placed or performed during the hospital encounter of 06/28/15 (from the past 24 hour(s))  Lipase, blood     Status: None   Collection Time: 06/29/15  3:26 AM  Result Value Ref Range   Lipase 19 11 - 51 U/L  Comprehensive metabolic panel     Status: Abnormal   Collection Time: 06/29/15  3:26 AM  Result Value Ref Range  Sodium 139 135 - 145 mmol/L   Potassium 4.2 3.5 - 5.1 mmol/L   Chloride 103 101 - 111 mmol/L   CO2 27 22 - 32 mmol/L   Glucose, Bld 109 (H) 65 - 99 mg/dL   BUN 12 6 - 20 mg/dL   Creatinine, Ser 0.71 0.44 - 1.00 mg/dL   Calcium 8.9 8.9 - 10.3 mg/dL   Total Protein 7.4 6.5 - 8.1 g/dL   Albumin 4.1 3.5 - 5.0 g/dL   AST 415 (H) 15 - 41 U/L   ALT 293 (H) 14 - 54 U/L   Alkaline Phosphatase 117 38 - 126 U/L   Total  Bilirubin 1.9 (H) 0.3 - 1.2 mg/dL   GFR calc non Af Amer >60 >60 mL/min   GFR calc Af Amer >60 >60 mL/min   Anion gap 9 5 - 15  CBC     Status: Abnormal   Collection Time: 07/15/15  3:26 AM  Result Value Ref Range   WBC 6.8 4.0 - 10.5 K/uL   RBC 4.13 3.87 - 5.11 MIL/uL   Hemoglobin 11.7 (L) 12.0 - 15.0 g/dL   HCT 35.5 (L) 36.0 - 46.0 %   MCV 86.0 78.0 - 100.0 fL   MCH 28.3 26.0 - 34.0 pg   MCHC 33.0 30.0 - 36.0 g/dL   RDW 13.8 11.5 - 15.5 %   Platelets 252 150 - 400 K/uL  I-Stat beta hCG blood, ED ([facility], [facility], [facility] only)     Status: None   Collection Time: 07/15/2015  3:32 AM  Result Value Ref Range   I-stat hCG, quantitative <5.0 <5 mIU/mL   Comment 3          Urinalysis, Routine w reflex microscopic (not at [facility])     Status: Abnormal   Collection Time: 07-15-15  4:25 AM  Result Value Ref Range   Color, Urine ORANGE (A) YELLOW   APPearance CLOUDY (A) CLEAR   Specific Gravity, Urine 1.034 (H) 1.005 - 1.030   pH 6.0 5.0 - 8.0   Glucose, UA NEGATIVE NEGATIVE mg/dL   Hgb urine dipstick NEGATIVE NEGATIVE   Bilirubin Urine MODERATE (A) NEGATIVE   Ketones, ur NEGATIVE NEGATIVE mg/dL   Protein, ur NEGATIVE NEGATIVE mg/dL   Nitrite POSITIVE (A) NEGATIVE   Leukocytes, UA SMALL (A) NEGATIVE  Urine microscopic-add on     Status: Abnormal   Collection Time: 15-Jul-2015  4:25 AM  Result Value Ref Range   Squamous Epithelial / LPF 0-5 (A) NONE SEEN   WBC, UA 0-5 0 - 5 WBC/hpf   RBC / HPF NONE SEEN 0 - 5 RBC/hpf   Bacteria, UA FEW (A) NONE SEEN   Urine-Other MUCOUS PRESENT     Imaging Studies: US Abdomen Limited Ruq  Jul 15, 2015  CLINICAL DATA:  Right upper quadrant pain starting last night. Known gallstones. EXAM: US ABDOMEN LIMITED - RIGHT UPPER QUADRANT COMPARISON:  06/25/2015 FINDINGS: Gallbladder: Multiple small stones layering in the dependent portion of the gallbladder. Largest stones measure about 6 mm diameter. No gallbladder wall thickening. Murphy's sign is negative. Small polyp is  again demonstrated along the nondependent surface measuring 5 mm consistent with benign lesion. Common bile duct: Diameter: 10 mm, mildly dilated. A stone is demonstrated in the mid/distal common bile duct measuring 6 mm in diameter. Liver: Mild intrahepatic bile duct dilatation is present. No focal liver lesions. Parenchymal echotexture is homogeneous. IMPRESSION: Cholelithiasis. No additional changes to suggest cholecystitis. Choledocholithiasis with mild dilatation of intra and extrahepatic bile  ducts. Electronically Signed   By: Lucienne Capers M.D.   On: 06/29/2015 06:19   7:26 AM [facility] to admit.     Shanon Rosser, MD 06/29/15 [phone]

## 2015-06-29 NOTE — H&P (Addendum)
History and Physical    Tiffany Blair [id number] DOB: 1985-10-11 DOA: 06/28/2015  Referring MD/NP/PA: Shanon Rosser PCP: [facility]  Outpatient Specialists: None Patient coming from: Home  Chief Complaint: RUQ pain  HPI: Tiffany Blair is a 30 y.o. female without significant medical history who presented for RUQ pain.  She was seen in the ED on 06/25/15 for similar symptoms and was told that she had gallstones.  She was sent home with vicoden, however, she notes that this did not control her pain for long.  She reports that she has had crampy RUQ abdominal pain after food for a while.  The pain would pass, however, and she has not required surgery.  She has been seen in the ED as far back as 2016 for this.  This time however, she reports sharp, intractable pain in the RUQ, epigastrium and radiating to the LUQ.  Worse with food.  10/10.  Better with rest and pain medications.  Associated symptoms include nausea, vomiting and decreased PO intake; she has had one episode of lightheadedness and burning pain in the chest.  Also feels cold and has constipation.   She further reports an intermittent sharp headache over her right eye that has come and gone for the past 2 weeks.  Currently headache free.    ED Course: Given fluids and pain medications, pain improved.  Ultrasound showed gallstones without cholecystitis and CBD dilation.   Review of Systems: As per HPI otherwise 10 point review of systems negative, specifically denies urinary symptoms, dysuria, suprapubic pain, skin changes.    Past Medical History  Diagnosis Date  . Medical history non-contributory   . Pregnancy     Past Surgical History  Procedure Laterality Date  . Wisdom tooth extraction      Reports that she has quit smoking. She reports that she drinks alcohol. She reports that she does not use illicit drugs.  No Known Allergies  Family History  Problem Relation Age of Onset  . Colon cancer Neg Hx   .  Cholelithiasis Neg Hx   . Diabetes Neg Hx   . Hypertension Neg Hx     Prior to Admission medications   Medication Sig Start Date End Date Taking? Authorizing Provider  acetaminophen (TYLENOL) 500 MG tablet Take 1,000 mg by mouth every 8 (eight) hours as needed for mild pain or headache. pain   Yes Historical Provider, MD - not taking    Physical Exam: Filed Vitals:   06/29/15 0730 06/29/15 0800 06/29/15 0820 06/29/15 0925  BP: 105/75 107/67 92/54   Pulse: 60 68 59   Temp:   98.4 F (36.9 C)   TempSrc:   Oral   Resp:   18   Height:    5\' 2"  (1.575 m)  Weight:    135 lb 2.3 oz (61.3 kg)  SpO2: 99% 97% 100%       Constitutional: NAD, calm, comfortable Filed Vitals:   06/29/15 0730 06/29/15 0800 06/29/15 0820 06/29/15 0925  BP: 105/75 107/67 92/54   Pulse: 60 68 59   Temp:   98.4 F (36.9 C)   TempSrc:   Oral   Resp:   18   Height:    5\' 2"  (1.575 m)  Weight:    135 lb 2.3 oz (61.3 kg)  SpO2: 99% 97% 100%    ENMT: Mucous membranes are moist. Normal dentition Neck: normal, supple Respiratory: clear to auscultation bilaterally, no wheezing, no crackles. Normal respiratory effort. No accessory  muscle use.  Cardiovascular: regular rate and normal rhythm, no murmurs / rubs / gallops. No extremity edema.   Abdomen: + BS, tenderness to deep palpation of the RUQ, otherwise non tender no masses palpated. Bowel sounds positive.  Musculoskeletal: no clubbing / cyanosis. Normal muscle tone.  Skin: no rashes, lesions, ulcers.  Neurologic: Grossly intact, muscle tone normal, sensation intact to light touch Psychiatric: Normal judgment and insight. Alert and oriented x 3. Somewhat flat affect  Labs on Admission: I have personally reviewed following labs and imaging studies  CBC:  Recent Labs Lab 06/25/15 1435 06/29/15 0326  WBC 7.2 6.8  NEUTROABS  --  5.1  HGB 12.8 11.7*  HCT 39.0 35.5*  MCV 83.5 86.0  PLT 256 [id number]   Basic Metabolic Panel:  Recent Labs Lab  06/25/15 1435 06/29/15 0326  NA 138 139  K 3.9 4.2  CL 105 103  CO2 24 27  GLUCOSE 101* 109*  BUN 11 12  CREATININE 0.79 0.71  CALCIUM 9.2 8.9   Liver Function Tests:  Recent Labs Lab 06/29/15 0326  AST 415*  ALT 293*  ALKPHOS 117  BILITOT 1.9*  PROT 7.4  ALBUMIN 4.1    Recent Labs Lab 06/25/15 2022 06/29/15 0326  LIPASE 18 19   Urine analysis:    Component Value Date/Time   COLORURINE ORANGE* 06/29/2015 0926   APPEARANCEUR CLOUDY* 06/29/2015 0926   LABSPEC 1.029 06/29/2015 0926   PHURINE 6.0 06/29/2015 0926   GLUCOSEU NEGATIVE 06/29/2015 0926   HGBUR SMALL* 06/29/2015 0926   BILIRUBINUR SMALL* 06/29/2015 0926   KETONESUR NEGATIVE 06/29/2015 0926   PROTEINUR NEGATIVE 06/29/2015 0926   UROBILINOGEN 1.0 12/28/2014 1640   NITRITE NEGATIVE 06/29/2015 0926   LEUKOCYTESUR NEGATIVE 06/29/2015 0926    Radiological Exams on Admission: US Abdomen Limited Ruq  06/29/2015  CLINICAL DATA:  Right upper quadrant pain starting last night. Known gallstones. EXAM: US ABDOMEN LIMITED - RIGHT UPPER QUADRANT COMPARISON:  06/25/2015 FINDINGS: Gallbladder: Multiple small stones layering in the dependent portion of the gallbladder. Largest stones measure about 6 mm diameter. No gallbladder wall thickening. Murphy's sign is negative. Small polyp is again demonstrated along the nondependent surface measuring 5 mm consistent with benign lesion. Common bile duct: Diameter: 10 mm, mildly dilated. A stone is demonstrated in the mid/distal common bile duct measuring 6 mm in diameter. Liver: Mild intrahepatic bile duct dilatation is present. No focal liver lesions. Parenchymal echotexture is homogeneous. IMPRESSION: Cholelithiasis. No additional changes to suggest cholecystitis. Choledocholithiasis with mild dilatation of intra and extrahepatic bile ducts. Electronically Signed   By: Lucienne Capers M.D.   On: 06/29/2015 06:19    Assessment/Plan  Choledocholithiasis with Transaminitis -  NPO - IVF with NS at 125cc/hr - Pain control with dilaudid - Nausea control with Zofran - GI consult, discussed with Dr. Cristina Gong, may try for ERCP tomorrow - Gen Surgery consult, will see patient  Nitrite + UA - Patient is completely asymptomatic, so in a young female would not treat at this time.  Repeat UA was negative for nitrite.  - Monitor for change in symptoms, elevated WBC or fevers.   Diet: NPO   DVT prophylaxis: Heparin Code Status: Full Disposition Plan: Dispo pending procedures, likely ERCP on 4/20 (specify when and where you expect patient to be discharged) Consults called: Dr. Cristina Gong, GI.   Gen Surg consulted Admission status: Inpatient  Gilles Chiquito MD [facility] Pager [phone]  If 7PM-7AM, please contact night-coverage [email or web address] Password [facility]  06/29/2015, 10:33  AM

## 2015-06-30 ENCOUNTER — Encounter (HOSPITAL_COMMUNITY): Payer: Self-pay | Admitting: Anesthesiology

## 2015-06-30 ENCOUNTER — Encounter (HOSPITAL_COMMUNITY)
Admission: EM | Disposition: A | Discharge status: Home / Self Care | Payer: Self-pay | Source: Home / Self Care | Attending: Internal Medicine

## 2015-06-30 ENCOUNTER — Inpatient Hospital Stay (HOSPITAL_COMMUNITY): Payer: Medicaid Other | Admitting: Anesthesiology

## 2015-06-30 ENCOUNTER — Inpatient Hospital Stay (HOSPITAL_COMMUNITY): Payer: Medicaid Other

## 2015-06-30 DIAGNOSIS — K8071 Calculus of gallbladder and bile duct without cholecystitis with obstruction: Principal | ICD-10-CM

## 2015-06-30 DIAGNOSIS — D649 Anemia, unspecified: Secondary | ICD-10-CM

## 2015-06-30 DIAGNOSIS — R740 Nonspecific elevation of levels of transaminase and lactic acid dehydrogenase [LDH]: Secondary | ICD-10-CM

## 2015-06-30 HISTORY — PX: ERCP: SHX5425

## 2015-06-30 LAB — TYPE AND SCREEN
ABO/RH(D): O POS
ANTIBODY SCREEN: NEGATIVE

## 2015-06-30 LAB — URINE CULTURE

## 2015-06-30 LAB — COMPREHENSIVE METABOLIC PANEL
ALT: 252 U/L — AB (ref 14–54)
AST: 191 U/L — ABNORMAL HIGH (ref 15–41)
Albumin: 3.5 g/dL (ref 3.5–5.0)
Alkaline Phosphatase: 109 U/L (ref 38–126)
Anion gap: 7 (ref 5–15)
BILIRUBIN TOTAL: 1.1 mg/dL (ref 0.3–1.2)
BUN: 8 mg/dL (ref 6–20)
CALCIUM: 8.7 mg/dL — AB (ref 8.9–10.3)
CHLORIDE: 109 mmol/L (ref 101–111)
CO2: 26 mmol/L (ref 22–32)
CREATININE: 0.74 mg/dL (ref 0.44–1.00)
Glucose, Bld: 96 mg/dL (ref 65–99)
Potassium: 4 mmol/L (ref 3.5–5.1)
Sodium: 142 mmol/L (ref 135–145)
TOTAL PROTEIN: 6.9 g/dL (ref 6.5–8.1)

## 2015-06-30 LAB — ABO/RH: ABO/RH(D): O POS

## 2015-06-30 LAB — SURGICAL PCR SCREEN
MRSA, PCR: NEGATIVE
STAPHYLOCOCCUS AUREUS: NEGATIVE

## 2015-06-30 SURGERY — ENDOSCOPIC RETROGRADE CHOLANGIOPANCREATOGRAPHY (ERCP) WITH PROPOFOL
Anesthesia: General

## 2015-06-30 SURGERY — ERCP, WITH INTERVENTION IF INDICATED
Anesthesia: General

## 2015-06-30 MED ORDER — GUAIFENESIN ER 600 MG PO TB12
600.0000 mg | ORAL_TABLET | Freq: Two times a day (BID) | ORAL | Status: DC
  Administered 2015-06-30 – 2015-07-02 (×5): 600 mg via ORAL
  Filled 2015-06-30 (×7): qty 1

## 2015-06-30 MED ORDER — GLYCOPYRROLATE 0.2 MG/ML IJ SOLN
INTRAMUSCULAR | Status: AC
  Filled 2015-06-30: qty 2

## 2015-06-30 MED ORDER — LIDOCAINE HCL (CARDIAC) 20 MG/ML IV SOLN
INTRAVENOUS | Status: AC
Start: 2015-06-30 — End: 2015-06-30
  Filled 2015-06-30: qty 5

## 2015-06-30 MED ORDER — MIDAZOLAM HCL 5 MG/5ML IJ SOLN
INTRAMUSCULAR | Status: DC | PRN
  Administered 2015-06-30: 1 mg via INTRAVENOUS

## 2015-06-30 MED ORDER — MENTHOL 3 MG MT LOZG
1.0000 | LOZENGE | OROMUCOSAL | Status: DC | PRN
  Filled 2015-06-30: qty 9

## 2015-06-30 MED ORDER — LIDOCAINE HCL (CARDIAC) 20 MG/ML IV SOLN
INTRAVENOUS | Status: DC | PRN
  Administered 2015-06-30: 60 mg via INTRAVENOUS

## 2015-06-30 MED ORDER — PROPOFOL 10 MG/ML IV BOLUS
INTRAVENOUS | Status: AC
  Filled 2015-06-30: qty 20

## 2015-06-30 MED ORDER — NEOSTIGMINE METHYLSULFATE 10 MG/10ML IV SOLN
INTRAVENOUS | Status: AC
  Filled 2015-06-30: qty 1

## 2015-06-30 MED ORDER — LACTATED RINGERS IV SOLN
INTRAVENOUS | Status: DC | PRN
  Administered 2015-06-30: 07:00:00 via INTRAVENOUS

## 2015-06-30 MED ORDER — MORPHINE SULFATE (PF) 2 MG/ML IV SOLN
2.0000 mg | INTRAVENOUS | Status: DC | PRN
  Administered 2015-06-30 – 2015-07-02 (×3): 2 mg via INTRAVENOUS
  Filled 2015-06-30 (×4): qty 1

## 2015-06-30 MED ORDER — DICLOFENAC SODIUM 1 % TD GEL
2.0000 g | Freq: Four times a day (QID) | TRANSDERMAL | Status: DC
  Administered 2015-06-30 – 2015-07-02 (×7): 2 g via TOPICAL
  Filled 2015-06-30: qty 100

## 2015-06-30 MED ORDER — GLUCAGON HCL RDNA (DIAGNOSTIC) 1 MG IJ SOLR
INTRAMUSCULAR | Status: AC
  Filled 2015-06-30: qty 1

## 2015-06-30 MED ORDER — INDOMETHACIN 50 MG RE SUPP
RECTAL | Status: AC
  Filled 2015-06-30: qty 2

## 2015-06-30 MED ORDER — PROPOFOL 10 MG/ML IV BOLUS
INTRAVENOUS | Status: DC | PRN
  Administered 2015-06-30: 50 mg via INTRAVENOUS
  Administered 2015-06-30: 150 mg via INTRAVENOUS

## 2015-06-30 MED ORDER — ONDANSETRON HCL 4 MG/2ML IJ SOLN
INTRAMUSCULAR | Status: AC
  Administered 2015-06-30: 4 mg
  Filled 2015-06-30: qty 2

## 2015-06-30 MED ORDER — ONDANSETRON HCL 4 MG/2ML IJ SOLN
INTRAMUSCULAR | Status: AC
  Filled 2015-06-30: qty 2

## 2015-06-30 MED ORDER — CEFAZOLIN SODIUM-DEXTROSE 2-4 GM/100ML-% IV SOLN
2.0000 g | INTRAVENOUS | Status: AC
  Administered 2015-07-01: 2 g via INTRAVENOUS
  Filled 2015-06-30: qty 100

## 2015-06-30 MED ORDER — SUGAMMADEX SODIUM 200 MG/2ML IV SOLN
INTRAVENOUS | Status: DC | PRN
  Administered 2015-06-30: 150 mg via INTRAVENOUS

## 2015-06-30 MED ORDER — ROCURONIUM BROMIDE 100 MG/10ML IV SOLN
INTRAVENOUS | Status: DC | PRN
  Administered 2015-06-30: 30 mg via INTRAVENOUS

## 2015-06-30 MED ORDER — SODIUM CHLORIDE 0.9 % IV SOLN
INTRAVENOUS | Status: DC | PRN
  Administered 2015-06-30: 09:00:00 via INTRAVENOUS

## 2015-06-30 MED ORDER — ONDANSETRON HCL 4 MG/2ML IJ SOLN: 4.0000 mg | Freq: Four times a day (QID) | INTRAMUSCULAR | Status: DC | PRN

## 2015-06-30 MED ORDER — ALUM & MAG HYDROXIDE-SIMETH 200-200-20 MG/5ML PO SUSP: 30.0000 mL | ORAL | Status: DC | PRN

## 2015-06-30 MED ORDER — ROCURONIUM BROMIDE 100 MG/10ML IV SOLN
INTRAVENOUS | Status: AC
  Filled 2015-06-30: qty 1

## 2015-06-30 MED ORDER — ONDANSETRON HCL 4 MG/2ML IJ SOLN
INTRAMUSCULAR | Status: DC | PRN
  Administered 2015-06-30: 4 mg via INTRAVENOUS

## 2015-06-30 MED ORDER — FENTANYL CITRATE (PF) 100 MCG/2ML IJ SOLN
INTRAMUSCULAR | Status: DC | PRN
  Administered 2015-06-30: 50 ug via INTRAVENOUS

## 2015-06-30 MED ORDER — SODIUM CHLORIDE 0.9 % IV SOLN
INTRAVENOUS | Status: DC | PRN
  Administered 2015-06-30: 50 mL

## 2015-06-30 MED ORDER — FENTANYL CITRATE (PF) 100 MCG/2ML IJ SOLN
INTRAMUSCULAR | Status: AC
  Filled 2015-06-30: qty 2

## 2015-06-30 MED ORDER — MIDAZOLAM HCL 2 MG/2ML IJ SOLN
INTRAMUSCULAR | Status: AC
  Filled 2015-06-30: qty 2

## 2015-06-30 MED ORDER — INDOMETHACIN 50 MG RE SUPP
100.0000 mg | Freq: Once | RECTAL | Status: AC
  Administered 2015-06-30: 100 mg via RECTAL

## 2015-06-30 NOTE — Progress Notes (Signed)
Placed call to Pt motherStanton Blair per request after procedure.  No answer, voicemail left.

## 2015-06-30 NOTE — Progress Notes (Signed)
Day of Surgery  Subjective: Sleepy after ERCP, complaining of chest and throat.  She has some GERD.  No abdominal pain.    Objective: Vital signs in last 24 hours: Temp:  [97.7 F (36.5 C)-98.7 F (37.1 C)] 98.7 F (37.1 C) (04/20 0957) Pulse Rate:  [55-66] 57 (04/20 0957) Resp:  [9-18] 14 (04/20 0957) BP: (98-108)/(23-64) 103/52 mmHg (04/20 0957) SpO2:  [94 %-100 %] 100 % (04/20 0957) Weight:  [61.236 kg (135 lb)] 61.236 kg (135 lb) (04/20 0657) Last BM Date: 06/28/15 480 PO Voided x 4 Afebrile, VSS Labs better ? UTI - culture pending   Intake/Output from previous day: 04/19 0701 - 04/20 0700 In: 2807.1 [P.O.:480; I.V.:2277.1; IV Piggyback:50] Out: -  Intake/Output this shift: Total I/O In: 1000 [I.V.:1000] Out: -   General appearance: alert, cooperative and no distress.  Sleepy after ERCP. GI: soft, non-tender; bowel sounds normal; no masses,  no organomegaly  Lab Results:   Recent Labs  06/29/15 0326  WBC 6.8  HGB 11.7*  HCT 35.5*  PLT 252    BMET  Recent Labs  06/29/15 0326 06/30/15 0514  NA 139 142  K 4.2 4.0  CL 103 109  CO2 27 26  GLUCOSE 109* 96  BUN 12 8  CREATININE 0.71 0.74  CALCIUM 8.9 8.7*   PT/INR No results for input(s): LABPROT, INR in the last 72 hours.   Recent Labs Lab 06/29/15 0326 06/30/15 0514  AST 415* 191*  ALT 293* 252*  ALKPHOS 117 109  BILITOT 1.9* 1.1  PROT 7.4 6.9  ALBUMIN 4.1 3.5     Lipase     Component Value Date/Time   LIPASE 19 06/29/2015 0326     Studies/Results: Dg Ercp Biliary & Pancreatic Ducts  06/30/2015  CLINICAL DATA:  30 year old female with a history of choledocholithiasis EXAM: ERCP TECHNIQUE: Multiple spot images obtained with the fluoroscopic device and submitted for interpretation post-procedure. FLUOROSCOPY TIME:  Fluoroscopy Time:  3 minutes 37 seconds COMPARISON:  None FINDINGS: Limited fluoroscopic spot images during ERCP. Initial images demonstrate endoscope over the upper abdomen  with cannulation of the ampulla. Infusion of contrast into the extrahepatic biliary system with partial opacification. Partial opacification of the intrahepatic ducts. No plastic stent at the conclusion of the case. IMPRESSION: Limited images during ERCP demonstrate partial opacification of the extrahepatic biliary ducts. Please refer to the dictated operative report for full details of intraoperative findings and procedure. Signed, Dulcy Fanny. Earleen Newport, DO [facility] Radiology Electronically Signed   By: Corrie Mckusick D.O.   On: 06/30/2015 09:08   US Abdomen Limited Ruq  06/29/2015  CLINICAL DATA:  Right upper quadrant pain starting last night. Known gallstones. EXAM: US ABDOMEN LIMITED - RIGHT UPPER QUADRANT COMPARISON:  06/25/2015 FINDINGS: Gallbladder: Multiple small stones layering in the dependent portion of the gallbladder. Largest stones measure about 6 mm diameter. No gallbladder wall thickening. Murphy's sign is negative. Small polyp is again demonstrated along the nondependent surface measuring 5 mm consistent with benign lesion. Common bile duct: Diameter: 10 mm, mildly dilated. A stone is demonstrated in the mid/distal common bile duct measuring 6 mm in diameter. Liver: Mild intrahepatic bile duct dilatation is present. No focal liver lesions. Parenchymal echotexture is homogeneous. IMPRESSION: Cholelithiasis. No additional changes to suggest cholecystitis. Choledocholithiasis with mild dilatation of intra and extrahepatic bile ducts. Electronically Signed   By: Lucienne Capers M.D.   On: 06/29/2015 06:19    Medications: . heparin  5,000 Units  Subcutaneous Q8H  . ondansetron (ZOFRAN) IV  4 mg Intravenous Once    Assessment/Plan ERCP with biliary sphincterotomy 06/30/15, Dr. Watt Climes Choledocholithiasis Elevated LFT's Antibiotics:   Rocephin pre op ERCP DVT:  Heparin/SCD  Plan:  I will check labs in AM and if OK we will plan cholecystectomy  tomorrow.  I will give her some mylanta for her chest pain, and defer to Dr. Wynelle Cleveland. I have put in for permit and preop Ancef.     LOS: 1 day    Yehudis Monceaux 06/30/2015 [phone]

## 2015-06-30 NOTE — Transfer of Care (Signed)
Immediate Anesthesia Transfer of Care Note  Patient: Tiffany Blair  Procedure(s) Performed: Procedure(s): ENDOSCOPIC RETROGRADE CHOLANGIOPANCREATOGRAPHY (ERCP) (N/A)  Patient Location: PACU and Endoscopy Unit  Anesthesia Type:General  Level of Consciousness: sedated  Airway & Oxygen Therapy: Patient Spontanous Breathing and Patient connected to face mask oxygen  Post-op Assessment: Report given to RN and Post -op Vital signs reviewed and stable  Post vital signs: Reviewed and stable  Last Vitals:  Filed Vitals:   06/30/15 0435 06/30/15 0657  BP: 108/64 105/59  Pulse: 57 60  Temp: 36.8 C 37.1 C  Resp: 18 9    Complications: No apparent anesthesia complications

## 2015-06-30 NOTE — Anesthesia Procedure Notes (Signed)
Procedure Name: Intubation Date/Time: 06/30/2015 7:53 AM Performed by: Cynda Familia Pre-anesthesia Checklist: Patient identified, Emergency Drugs available, Suction available and Patient being monitored Patient Re-evaluated:Patient Re-evaluated prior to inductionOxygen Delivery Method: Circle System Utilized Preoxygenation: Pre-oxygenation with 100% oxygen Intubation Type: IV induction Ventilation: Mask ventilation without difficulty Laryngoscope Size: Miller and 2 Grade View: Grade I Tube type: Oral Number of attempts: 1 Airway Equipment and Method: Stylet Placement Confirmation: ETT inserted through vocal cords under direct vision,  positive ETCO2 and breath sounds checked- equal and bilateral Secured at: 21 cm Tube secured with: Tape Dental Injury: Teeth and Oropharynx as per pre-operative assessment  Comments: Smooth IV induction---   Intubation atraumatic--- teeth and mouth as preop- bilat BS - Denenny

## 2015-06-30 NOTE — Anesthesia Postprocedure Evaluation (Signed)
Anesthesia Post Note  Patient: Tiffany Blair  Procedure(s) Performed: Procedure(s) (LRB): ENDOSCOPIC RETROGRADE CHOLANGIOPANCREATOGRAPHY (ERCP) (N/A)  Patient location during evaluation: PACU Anesthesia Type: General Level of consciousness: awake and alert Pain management: pain level controlled Vital Signs Assessment: post-procedure vital signs reviewed and stable Respiratory status: spontaneous breathing, nonlabored ventilation, respiratory function stable and patient connected to nasal cannula oxygen Cardiovascular status: blood pressure returned to baseline and stable Postop Assessment: no signs of nausea or vomiting Anesthetic complications: no    Last Vitals:  Filed Vitals:   06/30/15 0940 06/30/15 0957  BP: 101/54 103/52  Pulse:  57  Temp:  37.1 C  Resp:  14    Last Pain:  Filed Vitals:   06/30/15 0957  PainSc: 5                  Todrick Siedschlag J

## 2015-06-30 NOTE — Anesthesia Preprocedure Evaluation (Addendum)
Anesthesia Evaluation  Patient identified by MRN, date of birth, ID band Patient awake    Reviewed: Allergy & Precautions, NPO status , Patient's Chart, lab work & pertinent test results  Airway Mallampati: II  TM Distance: >3 FB Neck ROM: Full    Dental no notable dental hx. (+) Dental Advisory Given   Pulmonary former smoker,    Pulmonary exam normal breath sounds clear to auscultation       Cardiovascular negative cardio ROS Normal cardiovascular exam Rhythm:Regular Rate:Normal     Neuro/Psych negative neurological ROS  negative psych ROS   GI/Hepatic negative GI ROS, Neg liver ROS,   Endo/Other  negative endocrine ROS  Renal/GU negative Renal ROS  negative genitourinary   Musculoskeletal negative musculoskeletal ROS (+)   Abdominal   Peds negative pediatric ROS (+)  Hematology negative hematology ROS (+)   Anesthesia Other Findings   Reproductive/Obstetrics negative OB ROS                            Anesthesia Physical Anesthesia Plan  ASA: II  Anesthesia Plan: General   Post-op Pain Management:    Induction: Intravenous  Airway Management Planned: Oral ETT  Additional Equipment:   Intra-op Plan:   Post-operative Plan: Extubation in OR  Informed Consent: I have reviewed the patients History and Physical, chart, labs and discussed the procedure including the risks, benefits and alternatives for the proposed anesthesia with the patient or authorized representative who has indicated his/her understanding and acceptance.   Dental advisory given  Plan Discussed with: CRNA  Anesthesia Plan Comments:         Anesthesia Quick Evaluation

## 2015-06-30 NOTE — Op Note (Signed)
[facility] Patient Name: Tiffany Blair Procedure Date: 06/30/2015 MRN: [id number] Attending MD: Clarene Essex , MD Date of Birth: 29-Sep-1985 CSN:  Age: 30 Admit Type: Inpatient Procedure:                ERCP Indications:              Suspected bile duct stone(s) Providers:                Clarene Essex, MD, Cleda Daub, RN, Despina Pole,                            Technician, Glenis Smoker, CRNA Referring MD:              Medicines:                General Anesthesia Complications:            No immediate complications. Estimated Blood Loss:     Estimated blood loss: none. Procedure:                Pre-Anesthesia Assessment:                           - Prior to the procedure, a History and Physical                            was performed, and patient medications and                            allergies were reviewed. The patient's tolerance of                            previous anesthesia was also reviewed. The risks                            and benefits of the procedure and the sedation                            options and risks were discussed with the patient.                            All questions were answered, and informed consent                            was obtained. Prior Anticoagulants: The patient has                            taken no previous anticoagulant or antiplatelet                            agents. ASA Grade Assessment: I - A normal, healthy                            patient. After reviewing the risks and benefits,  the patient was deemed in satisfactory condition to                            undergo the procedure.                           After obtaining informed consent, the scope was                            passed under direct vision. Throughout the                            procedure, the patient's blood pressure, pulse, and                            oxygen saturations were monitored continuously. The                             [id number]) scope was introduced through                            the mouth, and used to inject contrast into and                            used to locate the major papilla. The ERCP was                            accomplished without difficulty. The patient                            tolerated the procedure well. Scope In: Scope Out: Findings:      The major papilla was normal.We initiallytried to cannulate using the       JAG Jagwire in the customary fashion but the wire went towards the       pancreas 2 times but no dye was injected and the sphincterotome was       repositioned and deep selective cannulation was obtained and dye was       insertedand a probable CBD stone was seen and then we proceeded with the       Biliary sphincterotomy which was made with a Hydratome sphincterotome       using ERBE electrocautery in the customary fashion until we had adequate       biliary drainage and could get the fully bowed sphincterotome easily in       and out of the duct. and we exchanged the sphincterotome for the 12th to       15 mm adjustable balloon There was no post-sphincterotomy bleeding.       Choledocholithiasis was found in a nondilated duct. The biliary tree was       swept with a 12 mm balloon starting at the bifurcation. One stone was       removed. No stones remained. we proceeded with 3 balloon pull-through's       with minimal resistance at the ampullaand then proceeded with an       occlusion cholangiogram which was negativeand the balloon was pulled  through the ampulla and there was adequate biliary drainageand no       residual abnormalities seen Impression:               - The major papilla appeared normal.                           - Choledocholithiasis was found. Complete removal                            was accomplished by biliary sphincterotomy and                            balloon extraction.                           -  A biliary sphincterotomy was performed.                           - The biliary tree was swept. the wire was advanced                            towards the pancreas to times but no dye was                            injected into the pancreas and there was adequate                            biliary drainage at the end of the procedure Moderate Sedation:      N/A- Per Anesthesia Care Recommendation:           - Avoid aspirin and nonsteroidal anti-inflammatory                            medicines for 5 days.                           - Clear liquid diet today.                           - Continue present medications.                           - Return to GI clinic PRN.                           - Telephone GI clinic if symptomatic PRN.                           - Refer to a surgeon today. Procedure Code(s):        --- Professional ---                           [phone], Esophagogastroduodenoscopy, flexible,                            transoral; diagnostic, including collection of  specimen(s) by brushing or washing, when performed                            (separate procedure) Diagnosis Code(s):        --- Professional ---                           K80.50, Calculus of bile duct without cholangitis                            or cholecystitis without obstruction CPT copyright 2016 [employer]. All rights reserved. The codes documented in this report are preliminary and upon coder review may  be revised to meet current compliance requirements. Clarene Essex, MD Clarene Essex, MD 06/30/2015 8:46:40 AM This report has been signed electronically. Number of Addenda: 0

## 2015-06-30 NOTE — Progress Notes (Signed)
PROGRESS NOTE    Tiffany Blair  [id number] DOB: 08/06/1985 DOA: 06/28/2015  PCP: [place]   Brief Narrative:  Tiffany Blair is a 30 y.o. female without significant medical history who presented for RUQ pain. She was seen in the ED on 06/25/15 for similar symptoms and was told that she had gallstones. She was sent home with vicoden, however, she notes that this did not control her pain for long. She reports that she has had crampy RUQ abdominal pain after food for a while. The pain would pass, however, and she has not required surgery. She has been seen in the ED as far back as 2016 for this. This time however, she reports sharp, intractable pain in the RUQ, epigastrium and radiating to the LUQ. Worse with food. 10/10. Better with rest and pain medications. Associated symptoms include nausea, vomiting and decreased PO intake; she has had one episode of lightheadedness and burning pain in the chest.  Assessment & Plan:   Principal Problem:   Calculus of gallbladder and bile duct with obstruction without cholecystitis - s/p ERCP and spincterotomy with removal of stones today - clear liquids and plan for lap chole tomorrow per gen surgery  Active Problems:   Transaminitis - due to above  Sore throat and left chest soreness after ERCP - lozenges and Voltaren gel    Anemia - chronically at 10-11- check anemia panel    DVT prophylaxis: Heparin Code Status: full  Family Communication:  Disposition Plan: home when stable   Consultants:   GI  Gen surg  Procedures:   ERCP   Antimicrobials:  Anti-infectives    Start     Dose/Rate Route Frequency Ordered Stop   07/01/15 0600  ceFAZolin (ANCEF) IVPB 2g/100 mL premix     2 g 200 mL/hr over 30 Minutes Intravenous On call to O.R. 06/30/15 1023 07/02/15 0559   06/30/15 0600  cefTRIAXone (ROCEPHIN) 1 g in dextrose 5 % 50 mL IVPB     1 g 100 mL/hr over 30 Minutes Intravenous  Once 06/29/15 1301 06/30/15  0543      Subjective: complains of pain in her throat and in chest- points to medial left chest wall above breast- no abdominal pain or nausea.  Objective: Filed Vitals:   06/30/15 0920 06/30/15 0930 06/30/15 0940 06/30/15 0957  BP: 101/60 105/54 101/54 103/52  Pulse: 60 64  57  Temp:    98.7 F (37.1 C)  TempSrc:    Axillary  Resp: 14 14  14   Height:      Weight:      SpO2: 99% 94%  100%    Intake/Output Summary (Last 24 hours) at 06/30/15 1216 Last data filed at 06/30/15 0852  Gross per 24 hour  Intake 3807.08 ml  Output      0 ml  Net 3807.08 ml   Filed Weights   06/29/15 0925 06/30/15 0657  Weight: 61.3 kg (135 lb 2.3 oz) 61.236 kg (135 lb)    Examination: General exam: Appears calm and comfortable  Respiratory system: Clear to auscultation. Respiratory effort normal. Chest: tender in left chest wall Cardiovascular system: S1 & S2 heard, RRR. No JVD, murmurs, rubs, gallops or clicks. No pedal edema. Gastrointestinal system: Abdomen is nondistended, soft and nontender. No organomegaly or masses felt. Normal bowel sounds heard. Central nervous system: Alert and oriented. No focal neurological deficits. Extremities: Symmetric 5 x 5 power. Skin: No rashes, lesions or ulcers Psychiatry: Judgement and insight appear normal.  Mood & affect appropriate.     Data Reviewed: I have personally reviewed following labs and imaging studies  CBC:  Recent Labs Lab 06/25/15 1435 06/29/15 0326  WBC 7.2 6.8  NEUTROABS  --  5.1  HGB 12.8 11.7*  HCT 39.0 35.5*  MCV 83.5 86.0  PLT 256 [id number]   Basic Metabolic Panel:  Recent Labs Lab 06/25/15 1435 06/29/15 0326 06/30/15 0514  NA 138 139 142  K 3.9 4.2 4.0  CL 105 103 109  CO2 24 27 26   GLUCOSE 101* 109* 96  BUN 11 12 8   CREATININE 0.79 0.71 0.74  CALCIUM 9.2 8.9 8.7*   GFR: Estimated Creatinine Clearance: 90.9 mL/min (by C-G formula based on Cr of 0.74). Liver Function Tests:  Recent Labs Lab 06/29/15 0326  06/30/15 0514  AST 415* 191*  ALT 293* 252*  ALKPHOS 117 109  BILITOT 1.9* 1.1  PROT 7.4 6.9  ALBUMIN 4.1 3.5    Recent Labs Lab 06/25/15 2022 06/29/15 0326  LIPASE 18 19   No results for input(s): AMMONIA in the last 168 hours. Coagulation Profile: No results for input(s): INR, PROTIME in the last 168 hours. Cardiac Enzymes: No results for input(s): CKTOTAL, CKMB, CKMBINDEX, TROPONINI in the last 168 hours. BNP (last 3 results) No results for input(s): PROBNP in the last 8760 hours. HbA1C: No results for input(s): HGBA1C in the last 72 hours. CBG: No results for input(s): GLUCAP in the last 168 hours. Lipid Profile: No results for input(s): CHOL, HDL, LDLCALC, TRIG, CHOLHDL, LDLDIRECT in the last 72 hours. Thyroid Function Tests: No results for input(s): TSH, T4TOTAL, FREET4, T3FREE, THYROIDAB in the last 72 hours. Anemia Panel: No results for input(s): VITAMINB12, FOLATE, FERRITIN, TIBC, IRON, RETICCTPCT in the last 72 hours. Urine analysis:    Component Value Date/Time   COLORURINE ORANGE* 06/29/2015 0926   APPEARANCEUR CLOUDY* 06/29/2015 0926   LABSPEC 1.029 06/29/2015 0926   PHURINE 6.0 06/29/2015 0926   GLUCOSEU NEGATIVE 06/29/2015 0926   HGBUR SMALL* 06/29/2015 0926   BILIRUBINUR SMALL* 06/29/2015 0926   KETONESUR NEGATIVE 06/29/2015 0926   PROTEINUR NEGATIVE 06/29/2015 0926   UROBILINOGEN 1.0 12/28/2014 1640   NITRITE NEGATIVE 06/29/2015 0926   LEUKOCYTESUR NEGATIVE 06/29/2015 0926   Sepsis Labs: @LABRCNTIP (procalcitonin:4,lacticidven:4)  ) Recent Results (from the past 240 hour(s))  Urine culture     Status: None   Collection Time: 06/29/15  9:26 AM  Result Value Ref Range Status   Specimen Description URINE, CLEAN CATCH  Final   Special Requests NONE  Final   Culture MULTIPLE SPECIES PRESENT, SUGGEST RECOLLECTION  Final   Report Status 06/30/2015 FINAL  Final         Radiology Studies: Dg Ercp Biliary & Pancreatic Ducts  06/30/2015   CLINICAL DATA:  30 year old female with a history of choledocholithiasis EXAM: ERCP TECHNIQUE: Multiple spot images obtained with the fluoroscopic device and submitted for interpretation post-procedure. FLUOROSCOPY TIME:  Fluoroscopy Time:  3 minutes 37 seconds COMPARISON:  None FINDINGS: Limited fluoroscopic spot images during ERCP. Initial images demonstrate endoscope over the upper abdomen with cannulation of the ampulla. Infusion of contrast into the extrahepatic biliary system with partial opacification. Partial opacification of the intrahepatic ducts. No plastic stent at the conclusion of the case. IMPRESSION: Limited images during ERCP demonstrate partial opacification of the extrahepatic biliary ducts. Please refer to the dictated operative report for full details of intraoperative findings and procedure. Signed, Dulcy Fanny. Earleen Newport, DO [facility] Radiology Electronically  Signed   By: Corrie Mckusick D.O.   On: 06/30/2015 09:08   US Abdomen Limited Ruq  06/29/2015  CLINICAL DATA:  Right upper quadrant pain starting last night. Known gallstones. EXAM: US ABDOMEN LIMITED - RIGHT UPPER QUADRANT COMPARISON:  06/25/2015 FINDINGS: Gallbladder: Multiple small stones layering in the dependent portion of the gallbladder. Largest stones measure about 6 mm diameter. No gallbladder wall thickening. Murphy's sign is negative. Small polyp is again demonstrated along the nondependent surface measuring 5 mm consistent with benign lesion. Common bile duct: Diameter: 10 mm, mildly dilated. A stone is demonstrated in the mid/distal common bile duct measuring 6 mm in diameter. Liver: Mild intrahepatic bile duct dilatation is present. No focal liver lesions. Parenchymal echotexture is homogeneous. IMPRESSION: Cholelithiasis. No additional changes to suggest cholecystitis. Choledocholithiasis with mild dilatation of intra and extrahepatic bile ducts. Electronically Signed   By:  Lucienne Capers M.D.   On: 06/29/2015 06:19        Scheduled Meds: . [START ON 07/01/2015]  ceFAZolin (ANCEF) IV  2 g Intravenous On Call to OR  . diclofenac sodium  2 g Topical QID  . heparin  5,000 Units Subcutaneous Q8H  . ondansetron (ZOFRAN) IV  4 mg Intravenous Once   Continuous Infusions:    LOS: 1 day    Time spent in minutes: 82    Jameson, MD [facility] Pager: [email or web address] Password [email or web address] 06/30/2015, 12:16 PM

## 2015-06-30 NOTE — Progress Notes (Signed)
Trinidad Curet 7:39 AM  Subjective: Patient doing well on some pain medicine without any new complaints and her case discussed with my partner Dr. Jacinto Reap and her hospital computer chart reviewed  Objective: Vital signs stable afebrile no acute distress exam please see preassessment evaluation liver tests decreased  Assessment: Probable CBD stones  Plan: The risks benefits methods and success rate of ERCP was rediscussed by me this morning and will proceed with anesthesia assistance with further workup and plans pending those findings  [facility]  Pager [phone] After 5PM or if no answer call [phone]

## 2015-07-01 ENCOUNTER — Encounter (HOSPITAL_COMMUNITY): Payer: Self-pay | Admitting: Certified Registered Nurse Anesthetist

## 2015-07-01 ENCOUNTER — Inpatient Hospital Stay (HOSPITAL_COMMUNITY): Payer: Medicaid Other | Admitting: Anesthesiology

## 2015-07-01 ENCOUNTER — Encounter (HOSPITAL_COMMUNITY)
Admission: EM | Disposition: A | Discharge status: Home / Self Care | Payer: Self-pay | Source: Home / Self Care | Attending: Internal Medicine

## 2015-07-01 ENCOUNTER — Inpatient Hospital Stay (HOSPITAL_COMMUNITY): Payer: Medicaid Other

## 2015-07-01 DIAGNOSIS — K8050 Calculus of bile duct without cholangitis or cholecystitis without obstruction: Secondary | ICD-10-CM

## 2015-07-01 HISTORY — PX: CHOLECYSTECTOMY: SHX55

## 2015-07-01 LAB — COMPREHENSIVE METABOLIC PANEL
ALBUMIN: 3.4 g/dL — AB (ref 3.5–5.0)
ALT: 181 U/L — ABNORMAL HIGH (ref 14–54)
ANION GAP: 11 (ref 5–15)
AST: 82 U/L — ABNORMAL HIGH (ref 15–41)
Alkaline Phosphatase: 98 U/L (ref 38–126)
BILIRUBIN TOTAL: 0.8 mg/dL (ref 0.3–1.2)
BUN: <5 mg/dL — ABNORMAL LOW (ref 6–20)
CO2: 26 mmol/L (ref 22–32)
Calcium: 8.9 mg/dL (ref 8.9–10.3)
Chloride: 103 mmol/L (ref 101–111)
Creatinine, Ser: 0.65 mg/dL (ref 0.44–1.00)
GFR calc non Af Amer: >60 mL/min (ref >60)
GLUCOSE: 99 mg/dL (ref 65–99)
POTASSIUM: 3.7 mmol/L (ref 3.5–5.1)
Sodium: 140 mmol/L (ref 135–145)
TOTAL PROTEIN: 6.4 g/dL — AB (ref 6.5–8.1)

## 2015-07-01 LAB — FOLATE: FOLATE: 10.4 ng/mL (ref >5.9)

## 2015-07-01 LAB — CBC WITH DIFFERENTIAL/PLATELET
BASOS PCT: 1 %
Basophils Absolute: 0 10*3/uL (ref 0.0–0.1)
EOS ABS: 0.1 10*3/uL (ref 0.0–0.7)
Eosinophils Relative: 3 %
HEMATOCRIT: 32 % — AB (ref 36.0–46.0)
Hemoglobin: 10.5 g/dL — ABNORMAL LOW (ref 12.0–15.0)
Lymphocytes Relative: 43 %
Lymphs Abs: 1.7 10*3/uL (ref 0.7–4.0)
MCH: 27.9 pg (ref 26.0–34.0)
MCHC: 32.8 g/dL (ref 30.0–36.0)
MCV: 84.9 fL (ref 78.0–100.0)
MONO ABS: 0.4 10*3/uL (ref 0.1–1.0)
MONOS PCT: 11 %
Neutro Abs: 1.6 10*3/uL — ABNORMAL LOW (ref 1.7–7.7)
Neutrophils Relative %: 42 %
Platelets: 226 10*3/uL (ref 150–400)
RBC: 3.77 MIL/uL — ABNORMAL LOW (ref 3.87–5.11)
RDW: 13.5 % (ref 11.5–15.5)
WBC: 3.8 10*3/uL — ABNORMAL LOW (ref 4.0–10.5)

## 2015-07-01 LAB — IRON AND TIBC
IRON: 124 ug/dL (ref 28–170)
Saturation Ratios: 54 % — ABNORMAL HIGH (ref 10.4–31.8)
TIBC: 228 ug/dL — ABNORMAL LOW (ref 250–450)
UIBC: 104 ug/dL

## 2015-07-01 LAB — VITAMIN B12: VITAMIN B 12: 527 pg/mL (ref 180–914)

## 2015-07-01 LAB — RETICULOCYTES
RBC.: 3.77 MIL/uL — AB (ref 3.87–5.11)
RETIC COUNT ABSOLUTE: 49 10*3/uL (ref 19.0–186.0)
Retic Ct Pct: 1.3 % (ref 0.4–3.1)

## 2015-07-01 LAB — LIPASE, BLOOD: LIPASE: 17 U/L (ref 11–51)

## 2015-07-01 LAB — FERRITIN: Ferritin: 171 ng/mL (ref 11–307)

## 2015-07-01 LAB — GLUCOSE, CAPILLARY: GLUCOSE-CAPILLARY: 86 mg/dL (ref 65–99)

## 2015-07-01 SURGERY — LAPAROSCOPIC CHOLECYSTECTOMY WITH INTRAOPERATIVE CHOLANGIOGRAM
Anesthesia: General

## 2015-07-01 MED ORDER — MIDAZOLAM HCL 5 MG/5ML IJ SOLN
INTRAMUSCULAR | Status: DC | PRN
Start: 2015-07-01 — End: 2015-07-01
  Administered 2015-07-01: 2 mg via INTRAVENOUS

## 2015-07-01 MED ORDER — PROMETHAZINE HCL 25 MG/ML IJ SOLN
INTRAMUSCULAR | Status: AC
  Filled 2015-07-01: qty 1

## 2015-07-01 MED ORDER — GLYCOPYRROLATE 0.2 MG/ML IJ SOLN
INTRAMUSCULAR | Status: DC | PRN
  Administered 2015-07-01: 0.4 mg via INTRAVENOUS

## 2015-07-01 MED ORDER — HYDROMORPHONE HCL 1 MG/ML IJ SOLN
INTRAMUSCULAR | Status: AC
  Filled 2015-07-01: qty 1

## 2015-07-01 MED ORDER — ACETAMINOPHEN 325 MG PO TABS
650.0000 mg | ORAL_TABLET | ORAL | Status: DC | PRN
  Administered 2015-07-01: 650 mg via ORAL
  Filled 2015-07-01: qty 2

## 2015-07-01 MED ORDER — IBUPROFEN 400 MG PO TABS
400.0000 mg | ORAL_TABLET | Freq: Once | ORAL | Status: AC
  Administered 2015-07-01: 400 mg via ORAL
  Filled 2015-07-01: qty 2
  Filled 2015-07-01: qty 1

## 2015-07-01 MED ORDER — ONDANSETRON HCL 4 MG/2ML IJ SOLN
INTRAMUSCULAR | Status: AC
  Filled 2015-07-01: qty 2

## 2015-07-01 MED ORDER — NEOSTIGMINE METHYLSULFATE 10 MG/10ML IV SOLN
INTRAVENOUS | Status: AC
  Filled 2015-07-01: qty 1

## 2015-07-01 MED ORDER — MIDAZOLAM HCL 2 MG/2ML IJ SOLN
INTRAMUSCULAR | Status: AC
  Filled 2015-07-01: qty 2

## 2015-07-01 MED ORDER — SUGAMMADEX SODIUM 200 MG/2ML IV SOLN
INTRAVENOUS | Status: AC
  Filled 2015-07-01: qty 2

## 2015-07-01 MED ORDER — GLYCOPYRROLATE 0.2 MG/ML IJ SOLN
INTRAMUSCULAR | Status: AC
  Filled 2015-07-01: qty 2

## 2015-07-01 MED ORDER — PROPOFOL 10 MG/ML IV BOLUS
INTRAVENOUS | Status: AC
  Filled 2015-07-01: qty 20

## 2015-07-01 MED ORDER — PROMETHAZINE HCL 25 MG/ML IJ SOLN
6.2500 mg | INTRAMUSCULAR | Status: DC | PRN
  Administered 2015-07-01: 6.25 mg via INTRAVENOUS
  Filled 2015-07-01: qty 1

## 2015-07-01 MED ORDER — NEOSTIGMINE METHYLSULFATE 10 MG/10ML IV SOLN
INTRAVENOUS | Status: DC | PRN
  Administered 2015-07-01: 3 mg via INTRAVENOUS

## 2015-07-01 MED ORDER — IOPAMIDOL (ISOVUE-300) INJECTION 61%
INTRAVENOUS | Status: AC
  Filled 2015-07-01: qty 50

## 2015-07-01 MED ORDER — ONDANSETRON HCL 4 MG/2ML IJ SOLN
INTRAMUSCULAR | Status: DC | PRN
  Administered 2015-07-01: 4 mg via INTRAVENOUS

## 2015-07-01 MED ORDER — IOPAMIDOL (ISOVUE-300) INJECTION 61%
INTRAVENOUS | Status: DC | PRN
  Administered 2015-07-01: 5 mL

## 2015-07-01 MED ORDER — LACTATED RINGERS IV SOLN
INTRAVENOUS | Status: DC
  Administered 2015-07-01: 15:00:00 via INTRAVENOUS
  Administered 2015-07-01: 1000 mL via INTRAVENOUS
  Administered 2015-07-01: 16:00:00 via INTRAVENOUS

## 2015-07-01 MED ORDER — MORPHINE SULFATE (PF) 2 MG/ML IV SOLN
1.0000 mg | INTRAVENOUS | Status: DC | PRN
  Administered 2015-07-01 (×2): 4 mg via INTRAVENOUS
  Administered 2015-07-01: 3 mg via INTRAVENOUS
  Administered 2015-07-02 (×2): 4 mg via INTRAVENOUS
  Filled 2015-07-01 (×2): qty 2
  Filled 2015-07-01: qty 1
  Filled 2015-07-01 (×2): qty 2

## 2015-07-01 MED ORDER — FENTANYL CITRATE (PF) 250 MCG/5ML IJ SOLN
INTRAMUSCULAR | Status: AC
  Filled 2015-07-01: qty 5

## 2015-07-01 MED ORDER — SUGAMMADEX SODIUM 200 MG/2ML IV SOLN: INTRAVENOUS | Status: DC | PRN

## 2015-07-01 MED ORDER — LIDOCAINE HCL (CARDIAC) 20 MG/ML IV SOLN
INTRAVENOUS | Status: AC
  Filled 2015-07-01: qty 5

## 2015-07-01 MED ORDER — LIDOCAINE HCL (CARDIAC) 20 MG/ML IV SOLN
INTRAVENOUS | Status: DC | PRN
  Administered 2015-07-01: 50 mg via INTRAVENOUS

## 2015-07-01 MED ORDER — HEPARIN SODIUM (PORCINE) 5000 UNIT/ML IJ SOLN
5000.0000 [IU] | Freq: Three times a day (TID) | INTRAMUSCULAR | Status: DC
  Filled 2015-07-01 (×3): qty 1

## 2015-07-01 MED ORDER — BUPIVACAINE-EPINEPHRINE 0.25% -1:200000 IJ SOLN
INTRAMUSCULAR | Status: DC | PRN
  Administered 2015-07-01: 30 mL

## 2015-07-01 MED ORDER — KETOROLAC TROMETHAMINE 30 MG/ML IJ SOLN
INTRAMUSCULAR | Status: AC
  Filled 2015-07-01: qty 1

## 2015-07-01 MED ORDER — HYDROMORPHONE HCL 1 MG/ML IJ SOLN
0.2500 mg | INTRAMUSCULAR | Status: DC | PRN
  Administered 2015-07-01 (×2): 0.5 mg via INTRAVENOUS

## 2015-07-01 MED ORDER — PROPOFOL 10 MG/ML IV BOLUS
INTRAVENOUS | Status: DC | PRN
  Administered 2015-07-01: 50 mg via INTRAVENOUS
  Administered 2015-07-01: 160 mg via INTRAVENOUS

## 2015-07-01 MED ORDER — CEFAZOLIN SODIUM-DEXTROSE 2-4 GM/100ML-% IV SOLN
INTRAVENOUS | Status: AC
  Filled 2015-07-01: qty 100

## 2015-07-01 MED ORDER — ROCURONIUM BROMIDE 100 MG/10ML IV SOLN
INTRAVENOUS | Status: DC | PRN
  Administered 2015-07-01: 40 mg via INTRAVENOUS

## 2015-07-01 MED ORDER — KETOROLAC TROMETHAMINE 30 MG/ML IJ SOLN
30.0000 mg | Freq: Once | INTRAMUSCULAR | Status: AC | PRN
  Administered 2015-07-01: 30 mg via INTRAVENOUS

## 2015-07-01 MED ORDER — FENTANYL CITRATE (PF) 100 MCG/2ML IJ SOLN
INTRAMUSCULAR | Status: DC | PRN
  Administered 2015-07-01: 100 ug via INTRAVENOUS
  Administered 2015-07-01: 50 ug via INTRAVENOUS
  Administered 2015-07-01: 100 ug via INTRAVENOUS

## 2015-07-01 MED ORDER — LACTATED RINGERS IR SOLN
Status: DC | PRN
  Administered 2015-07-01: 1000 mL

## 2015-07-01 MED ORDER — OXYCODONE-ACETAMINOPHEN 5-325 MG PO TABS
1.0000 | ORAL_TABLET | ORAL | Status: DC | PRN
  Administered 2015-07-02 (×2): 2 via ORAL
  Filled 2015-07-01 (×2): qty 2

## 2015-07-01 SURGICAL SUPPLY — 34 items
APPLIER CLIP 5 13 M/L LIGAMAX5 (MISCELLANEOUS) ×2
APR CLP MED LRG 5 ANG JAW (MISCELLANEOUS) ×1
BAG SPEC RTRVL LRG 6X4 10 (ENDOMECHANICALS) ×1
CABLE HIGH FREQUENCY MONO STRZ (ELECTRODE) ×2 IMPLANT
CATH REDDICK CHOLANGI 4FR 50CM (CATHETERS) ×2 IMPLANT
CHLORAPREP W/TINT 26ML (MISCELLANEOUS) ×2 IMPLANT
CLIP APPLIE 5 13 M/L LIGAMAX5 (MISCELLANEOUS) ×1 IMPLANT
COVER MAYO STAND STRL (DRAPES) ×2 IMPLANT
COVER SURGICAL LIGHT HANDLE (MISCELLANEOUS) ×2 IMPLANT
DECANTER SPIKE VIAL GLASS SM (MISCELLANEOUS) ×2 IMPLANT
DRAPE C-ARM 42X120 X-RAY (DRAPES) ×2 IMPLANT
DRAPE LAPAROSCOPIC ABDOMINAL (DRAPES) ×2 IMPLANT
ELECT REM PT RETURN 9FT ADLT (ELECTROSURGICAL) ×2
ELECTRODE REM PT RTRN 9FT ADLT (ELECTROSURGICAL) ×1 IMPLANT
ENDOLOOP SUT PDS II  0 18 (SUTURE) ×1
ENDOLOOP SUT PDS II 0 18 (SUTURE) IMPLANT
GLOVE BIO SURGEON STRL SZ7.5 (GLOVE) ×9 IMPLANT
GOWN STRL REUS W/TWL XL LVL3 (GOWN DISPOSABLE) ×7 IMPLANT
HEMOSTAT SURGICEL 4X8 (HEMOSTASIS) IMPLANT
IV CATH 14GX2 1/4 (CATHETERS) ×2 IMPLANT
KIT BASIN OR (CUSTOM PROCEDURE TRAY) ×2 IMPLANT
LIQUID BAND (GAUZE/BANDAGES/DRESSINGS) ×2 IMPLANT
POUCH SPECIMEN RETRIEVAL 10MM (ENDOMECHANICALS) ×1 IMPLANT
SCISSORS LAP 5X35 DISP (ENDOMECHANICALS) ×2 IMPLANT
SET IRRIG TUBING LAPAROSCOPIC (IRRIGATION / IRRIGATOR) ×2 IMPLANT
SLEEVE XCEL OPT CAN 5 100 (ENDOMECHANICALS) ×4 IMPLANT
SUT MNCRL AB 4-0 PS2 18 (SUTURE) ×3 IMPLANT
TAPE CLOTH 4X10 WHT NS (GAUZE/BANDAGES/DRESSINGS) IMPLANT
TOWEL OR 17X26 10 PK STRL BLUE (TOWEL DISPOSABLE) ×2 IMPLANT
TOWEL OR NON WOVEN STRL DISP B (DISPOSABLE) ×2 IMPLANT
TRAY LAPAROSCOPIC (CUSTOM PROCEDURE TRAY) ×2 IMPLANT
TROCAR BLADELESS OPT 5 100 (ENDOMECHANICALS) ×2 IMPLANT
TROCAR XCEL BLUNT TIP 100MML (ENDOMECHANICALS) ×2 IMPLANT
TUBING INSUFFLATOR HI FLOW (MISCELLANEOUS) ×2 IMPLANT

## 2015-07-01 NOTE — Progress Notes (Signed)
Comfortable following yesterday's ERCP w/ CBD stone removal.  Afebrile, sitting up in bed, smiling, wanting to eat.   For lap chole later today.  Abd soft, NT, +BS's.  LFT's improving.  Will sign off; call if IOC+ or if we can be of further help.  Cleotis Nipper, M.D. Pager [phone] If no answer or after 5 PM call [phone]

## 2015-07-01 NOTE — Anesthesia Postprocedure Evaluation (Signed)
Anesthesia Post Note  Patient: EVALYNA VADNAIS  Procedure(s) Performed: Procedure(s) (LRB): LAPAROSCOPIC CHOLECYSTECTOMY WITH INTRAOPERATIVE CHOLANGIOGRAM (N/A)  Patient location during evaluation: PACU Anesthesia Type: General Level of consciousness: awake and alert Pain management: pain level controlled Vital Signs Assessment: post-procedure vital signs reviewed and stable Respiratory status: spontaneous breathing, nonlabored ventilation, respiratory function stable and patient connected to nasal cannula oxygen Cardiovascular status: blood pressure returned to baseline and stable Postop Assessment: no signs of nausea or vomiting Anesthetic complications: no    Last Vitals:  Filed Vitals:   07/01/15 1723 07/01/15 1730  BP:  107/71  Pulse: 52 52  Temp:    Resp: 11 12    Last Pain:  Filed Vitals:   07/01/15 1731  PainSc: Asleep                 Breena Bevacqua S

## 2015-07-01 NOTE — Anesthesia Preprocedure Evaluation (Signed)
Anesthesia Evaluation  Patient identified by MRN, date of birth, ID band Patient awake    Reviewed: Allergy & Precautions, NPO status , Patient's Chart, lab work & pertinent test results  Airway Mallampati: II  TM Distance: >3 FB Neck ROM: Full    Dental no notable dental hx.    Pulmonary neg pulmonary ROS, former smoker,    Pulmonary exam normal breath sounds clear to auscultation       Cardiovascular negative cardio ROS Normal cardiovascular exam Rhythm:Regular Rate:Normal     Neuro/Psych negative neurological ROS  negative psych ROS   GI/Hepatic negative GI ROS, Neg liver ROS,   Endo/Other  negative endocrine ROS  Renal/GU negative Renal ROS  negative genitourinary   Musculoskeletal negative musculoskeletal ROS (+)   Abdominal   Peds negative pediatric ROS (+)  Hematology negative hematology ROS (+)   Anesthesia Other Findings   Reproductive/Obstetrics negative OB ROS                             Anesthesia Physical Anesthesia Plan  ASA: I  Anesthesia Plan: General   Post-op Pain Management:    Induction: Intravenous  Airway Management Planned: Oral ETT  Additional Equipment:   Intra-op Plan:   Post-operative Plan: Extubation in OR  Informed Consent: I have reviewed the patients History and Physical, chart, labs and discussed the procedure including the risks, benefits and alternatives for the proposed anesthesia with the patient or authorized representative who has indicated his/her understanding and acceptance.   Dental advisory given  Plan Discussed with: CRNA and Surgeon  Anesthesia Plan Comments:         Anesthesia Quick Evaluation  

## 2015-07-01 NOTE — Progress Notes (Signed)
PROGRESS NOTE    Tiffany Blair  [id number] DOB: 17-Jul-1985 DOA: 06/28/2015  PCP: [place]   Brief Narrative:  Tiffany Blair is a 30 y.o. female without significant medical history who presented for RUQ pain. She was seen in the ED on 06/25/15 for similar symptoms and was told that she had gallstones. She was sent home with vicoden, however, she notes that this did not control her pain for long. She reports that she has had crampy RUQ abdominal pain after food for a while. The pain would pass, however, and she has not required surgery. She has been seen in the ED as far back as 2016 for this. This time however, she reports sharp, intractable pain in the RUQ, epigastrium and radiating to the LUQ. Worse with food. 10/10. Better with rest and pain medications. Associated symptoms include nausea, vomiting and decreased PO intake; she has had one episode of lightheadedness and burning pain in the chest.  Assessment & Plan:   Principal Problem:   Calculus of gallbladder and bile duct with obstruction without cholecystitis - s/p ERCP and spincterotomy with removal of stones 4/20 - plan for lap chole today  Active Problems:   Transaminitis - due to above  Sore throat and left chest soreness after ERCP - lozenges and Voltaren gel- resolved    Anemia - chronically at 10-11- check anemia panel    DVT prophylaxis: Heparin Code Status: full  Family Communication:  Disposition Plan: home when stable   Consultants:   GI  Gen surg  Procedures:   ERCP   Antimicrobials:  Anti-infectives    Start     Dose/Rate Route Frequency Ordered Stop   07/01/15 0600  ceFAZolin (ANCEF) IVPB 2g/100 mL premix     2 g 200 mL/hr over 30 Minutes Intravenous On call to O.R. 06/30/15 1023 07/02/15 0559   06/30/15 0600  cefTRIAXone (ROCEPHIN) 1 g in dextrose 5 % 50 mL IVPB     1 g 100 mL/hr over 30 Minutes Intravenous  Once 06/29/15 1301 06/30/15 0543       Subjective: Has a headache-  no further chest pain today, no abdominal pain or nausea.  Objective: Filed Vitals:   06/30/15 0957 06/30/15 1553 06/30/15 2139 07/01/15 0527  BP: 103/52 100/60 101/59 96/56  Pulse: 57 63 66 76  Temp: 98.7 F (37.1 C) 98.6 F (37 C) 98.7 F (37.1 C) 98 F (36.7 C)  TempSrc: Axillary Oral Oral Oral  Resp: 14 18 18 18   Height:      Weight:      SpO2: 100% 100% 100% 99%   No intake or output data in the 24 hours ending 07/01/15 1259 Filed Weights   06/29/15 0925 06/30/15 0657  Weight: 61.3 kg (135 lb 2.3 oz) 61.236 kg (135 lb)    Examination: General exam: Appears calm and comfortable  Respiratory system: Clear to auscultation. Respiratory effort normal. Chest: tender in left chest wall Cardiovascular system: S1 & S2 heard, RRR. No JVD, murmurs, rubs, gallops or clicks. No pedal edema. Gastrointestinal system: Abdomen is nondistended, soft and nontender. No organomegaly or masses felt. Normal bowel sounds heard. Central nervous system: Alert and oriented. No focal neurological deficits. Extremities: Symmetric 5 x 5 power. Skin: No rashes, lesions or ulcers Psychiatry: Judgement and insight appear normal. Mood & affect appropriate.     Data Reviewed: I have personally reviewed following labs and imaging studies  CBC:  Recent Labs Lab 06/25/15 1435 06/29/15 0326 07/01/15 [id number]  WBC 7.2 6.8 3.8*  NEUTROABS  --  5.1 1.6*  HGB 12.8 11.7* 10.5*  HCT 39.0 35.5* 32.0*  MCV 83.5 86.0 84.9  PLT 256 252 [id number]   Basic Metabolic Panel:  Recent Labs Lab 06/25/15 1435 06/29/15 0326 06/30/15 0514 07/01/15 0521  NA 138 139 142 140  K 3.9 4.2 4.0 3.7  CL 105 103 109 103  CO2 24 27 26 26   GLUCOSE 101* 109* 96 99  BUN 11 12 8  <5*  CREATININE 0.79 0.71 0.74 0.65  CALCIUM 9.2 8.9 8.7* 8.9   GFR: Estimated Creatinine Clearance: 90.9 mL/min (by C-G formula based on Cr of 0.65). Liver Function Tests:  Recent Labs Lab 06/29/15 0326  06/30/15 0514 07/01/15 0521  AST 415* 191* 82*  ALT 293* 252* 181*  ALKPHOS 117 109 98  BILITOT 1.9* 1.1 0.8  PROT 7.4 6.9 6.4*  ALBUMIN 4.1 3.5 3.4*    Recent Labs Lab 06/25/15 2022 06/29/15 0326 07/01/15 0521  LIPASE 18 19 17    No results for input(s): AMMONIA in the last 168 hours. Coagulation Profile: No results for input(s): INR, PROTIME in the last 168 hours. Cardiac Enzymes: No results for input(s): CKTOTAL, CKMB, CKMBINDEX, TROPONINI in the last 168 hours. BNP (last 3 results) No results for input(s): PROBNP in the last 8760 hours. HbA1C: No results for input(s): HGBA1C in the last 72 hours. CBG:  Recent Labs Lab 07/01/15 1039  GLUCAP 86   Lipid Profile: No results for input(s): CHOL, HDL, LDLCALC, TRIG, CHOLHDL, LDLDIRECT in the last 72 hours. Thyroid Function Tests: No results for input(s): TSH, T4TOTAL, FREET4, T3FREE, THYROIDAB in the last 72 hours. Anemia Panel:  Recent Labs  07/01/15 0521  VITAMINB12 527  FOLATE 10.4  FERRITIN 171  TIBC 228*  IRON 124  RETICCTPCT 1.3   Urine analysis:    Component Value Date/Time   COLORURINE ORANGE* 06/29/2015 0926   APPEARANCEUR CLOUDY* 06/29/2015 0926   LABSPEC 1.029 06/29/2015 0926   PHURINE 6.0 06/29/2015 0926   GLUCOSEU NEGATIVE 06/29/2015 0926   HGBUR SMALL* 06/29/2015 0926   BILIRUBINUR SMALL* 06/29/2015 0926   KETONESUR NEGATIVE 06/29/2015 0926   PROTEINUR NEGATIVE 06/29/2015 0926   UROBILINOGEN 1.0 12/28/2014 1640   NITRITE NEGATIVE 06/29/2015 0926   LEUKOCYTESUR NEGATIVE 06/29/2015 0926   Sepsis Labs: @LABRCNTIP (procalcitonin:4,lacticidven:4)  ) Recent Results (from the past 240 hour(s))  Urine culture     Status: None   Collection Time: 06/29/15  9:26 AM  Result Value Ref Range Status   Specimen Description URINE, CLEAN CATCH  Final   Special Requests NONE  Final   Culture MULTIPLE SPECIES PRESENT, SUGGEST RECOLLECTION  Final   Report Status 06/30/2015 FINAL  Final  Surgical pcr  screen     Status: None   Collection Time: 06/30/15  5:58 PM  Result Value Ref Range Status   MRSA, PCR NEGATIVE NEGATIVE Final   Staphylococcus aureus NEGATIVE NEGATIVE Final    Comment:        The Xpert SA Assay (FDA approved for NASAL specimens in patients over 76 years of age), is one component of a comprehensive surveillance program.  Test performance has been validated by [facility] for patients greater than or equal to 34 year old. It is not intended to diagnose infection nor to guide or monitor treatment.          Radiology Studies: Dg Ercp Biliary & Pancreatic Ducts  06/30/2015  CLINICAL DATA:  30 year old female with a history of choledocholithiasis EXAM:  ERCP TECHNIQUE: Multiple spot images obtained with the fluoroscopic device and submitted for interpretation post-procedure. FLUOROSCOPY TIME:  Fluoroscopy Time:  3 minutes 37 seconds COMPARISON:  None FINDINGS: Limited fluoroscopic spot images during ERCP. Initial images demonstrate endoscope over the upper abdomen with cannulation of the ampulla. Infusion of contrast into the extrahepatic biliary system with partial opacification. Partial opacification of the intrahepatic ducts. No plastic stent at the conclusion of the case. IMPRESSION: Limited images during ERCP demonstrate partial opacification of the extrahepatic biliary ducts. Please refer to the dictated operative report for full details of intraoperative findings and procedure. Signed, Dulcy Fanny. Earleen Newport, DO [facility] Electronically Signed   By: Corrie Mckusick D.O.   On: 06/30/2015 09:08        Scheduled Meds: .  ceFAZolin (ANCEF) IV  2 g Intravenous On Call to OR  . diclofenac sodium  2 g Topical QID  . guaiFENesin  600 mg Oral BID  . [START ON 07/02/2015] heparin  5,000 Units Subcutaneous Q8H  . ondansetron (ZOFRAN) IV  4 mg Intravenous Once   Continuous Infusions:    LOS: 2 days    Time spent  in minutes: 42    [place], MD [facility] Pager: [email or web address] Password [facility] 07/01/2015, 12:59 PM

## 2015-07-01 NOTE — Progress Notes (Signed)
1 Day Post-Op  Subjective: Complaining of headache, no abdominal pain.  For surgery later today.   Objective: Vital signs in last 24 hours: Temp:  [98 F (36.7 C)-98.7 F (37.1 C)] 98 F (36.7 C) (04/21 0527) Pulse Rate:  [57-76] 76 (04/21 0527) Resp:  [14-18] 18 (04/21 0527) BP: (96-103)/(52-60) 96/56 mmHg (04/21 0527) SpO2:  [99 %-100 %] 99 % (04/21 0527) Last BM Date: 06/28/15 Afebrile VSS Nothing PO recorded Labs ok No film Intake/Output from previous day: 04/20 0701 - 04/21 0700 In: 1000 [I.V.:1000] Out: -  Intake/Output this shift:    General appearance: alert, cooperative and no distress GI: soft, non-tender; bowel sounds normal; no masses,  no organomegaly  Lab Results:   Recent Labs  06/29/15 0326 07/01/15 0521  WBC 6.8 3.8*  HGB 11.7* 10.5*  HCT 35.5* 32.0*  PLT 252 226    BMET  Recent Labs  06/30/15 0514 07/01/15 0521  NA 142 140  K 4.0 3.7  CL 109 103  CO2 26 26  GLUCOSE 96 99  BUN 8 <5*  CREATININE 0.74 0.65  CALCIUM 8.7* 8.9   PT/INR No results for input(s): LABPROT, INR in the last 72 hours.   Recent Labs Lab 06/29/15 0326 06/30/15 0514 07/01/15 0521  AST 415* 191* 82*  ALT 293* 252* 181*  ALKPHOS 117 109 98  BILITOT 1.9* 1.1 0.8  PROT 7.4 6.9 6.4*  ALBUMIN 4.1 3.5 3.4*     Lipase     Component Value Date/Time   LIPASE 17 07/01/2015 0521     Studies/Results: Dg Ercp Biliary & Pancreatic Ducts  06/30/2015  CLINICAL DATA:  30 year old female with a history of choledocholithiasis EXAM: ERCP TECHNIQUE: Multiple spot images obtained with the fluoroscopic device and submitted for interpretation post-procedure. FLUOROSCOPY TIME:  Fluoroscopy Time:  3 minutes 37 seconds COMPARISON:  None FINDINGS: Limited fluoroscopic spot images during ERCP. Initial images demonstrate endoscope over the upper abdomen with cannulation of the ampulla. Infusion of contrast into the extrahepatic biliary system with partial opacification. Partial  opacification of the intrahepatic ducts. No plastic stent at the conclusion of the case. IMPRESSION: Limited images during ERCP demonstrate partial opacification of the extrahepatic biliary ducts. Please refer to the dictated operative report for full details of intraoperative findings and procedure. Signed, Dulcy Fanny. Earleen Newport, DO [facility] Electronically Signed   By: Corrie Mckusick D.O.   On: 06/30/2015 09:08    Medications: .  ceFAZolin (ANCEF) IV  2 g Intravenous On Call to OR  . diclofenac sodium  2 g Topical QID  . guaiFENesin  600 mg Oral BID  . heparin  5,000 Units Subcutaneous Q8H  . ondansetron (ZOFRAN) IV  4 mg Intravenous Once    Assessment/Plan ERCP with biliary sphincterotomy 06/30/15, Dr. Watt Climes Choledocholithiasis Elevated LFT's Antibiotics: Rocephin pre op ERCP DVT: Heparin/SCD    Plan:  OR later today.    LOS: 2 days    Tiffany Blair 07/01/2015 [phone]

## 2015-07-01 NOTE — Anesthesia Procedure Notes (Signed)
Procedure Name: Intubation Performed by: Gean Maidens Pre-anesthesia Checklist: Patient identified, Emergency Drugs available, Suction available, Patient being monitored and Timeout performed Patient Re-evaluated:Patient Re-evaluated prior to inductionOxygen Delivery Method: Circle system utilized Preoxygenation: Pre-oxygenation with 100% oxygen Intubation Type: IV induction Ventilation: Mask ventilation without difficulty Laryngoscope Size: Mac and 3 Grade View: Grade I Tube type: Oral Number of attempts: 1 Airway Equipment and Method: Stylet Placement Confirmation: ETT inserted through vocal cords under direct vision,  positive ETCO2,  CO2 detector and breath sounds checked- equal and bilateral Secured at: 21 cm Tube secured with: Tape Dental Injury: Teeth and Oropharynx as per pre-operative assessment

## 2015-07-01 NOTE — Transfer of Care (Signed)
Immediate Anesthesia Transfer of Care Note  Patient: Tiffany Blair  Procedure(s) Performed: Procedure(s): LAPAROSCOPIC CHOLECYSTECTOMY WITH INTRAOPERATIVE CHOLANGIOGRAM (N/A)  Patient Location: PACU  Anesthesia Type:General  Level of Consciousness: sedated, patient cooperative and responds to stimulation  Airway & Oxygen Therapy: Patient Spontanous Breathing and Patient connected to face mask oxygen  Post-op Assessment: Report given to RN and Post -op Vital signs reviewed and stable  Post vital signs: Reviewed and stable  Last Vitals:  Filed Vitals:   07/01/15 0527 07/01/15 1300  BP: 96/56 91/51  Pulse: 76 53  Temp: 36.7 C 37.1 C  Resp: 18 19    Complications: No apparent anesthesia complications

## 2015-07-01 NOTE — Op Note (Signed)
06/28/2015 - 07/01/2015  4:20 PM  PATIENT:  Tiffany Blair  30 y.o. female  PRE-OPERATIVE DIAGNOSIS:  gallstones     POST-OPERATIVE DIAGNOSIS:  gallstones  PROCEDURE:  Procedure(s): LAPAROSCOPIC CHOLECYSTECTOMY WITH INTRAOPERATIVE CHOLANGIOGRAM (N/A)  SURGEON:  Surgeon(s) and Role:    * Jovita Kussmaul, MD - Primary  PHYSICIAN ASSISTANT:   ASSISTANTS: none   ANESTHESIA:   general  EBL:  Total I/O In: 1000 [I.V.:1000] Out: 10 [Blood:10]  BLOOD ADMINISTERED:none  DRAINS: none   LOCAL MEDICATIONS USED:  MARCAINE     SPECIMEN:  Source of Specimen:  gallbladder  DISPOSITION OF SPECIMEN:  PATHOLOGY  COUNTS:  YES  TOURNIQUET:  * No tourniquets in log *  DICTATION: .Dragon Dictation   Procedure: After informed consent was obtained the patient was brought to the operating room and placed in the supine position on the operating room table. After adequate induction of general anesthesia the patient's abdomen was prepped with ChloraPrep allowed to dry and draped in usual sterile manner. The area below the umbilicus was infiltrated with quarter percent  Marcaine. A small incision was made with a 15 blade knife. The incision was carried down through the subcutaneous tissue bluntly with a hemostat and [employer] retractors. The linea alba was identified. The linea alba was incised with a 15 blade knife and each side was grasped with Coker clamps. The preperitoneal space was then probed with a hemostat until the peritoneum was opened and access was gained to the abdominal cavity. A 0 Vicryl pursestring stitch was placed in the fascia surrounding the opening. A Hassan cannula was then placed through the opening and anchored in place with the previously placed Vicryl purse string stitch. The abdomen was insufflated with carbon dioxide without difficulty. A laparoscope was inserted through the [facility] cannula in the right upper quadrant was inspected. Next the epigastric region was infiltrated  with % Marcaine. A small incision was made with a 15 blade knife. A 5 mm port was placed bluntly through this incision into the abdominal cavity under direct vision. Next 2 sites were chosen laterally on the right side of the abdomen for placement of 5 mm ports. Each of these areas was infiltrated with quarter percent Marcaine. Small stab incisions were made with a 15 blade knife. 5 mm ports were then placed bluntly through these incisions into the abdominal cavity under direct vision without difficulty. A blunt grasper was placed through the lateralmost 5 mm port and used to grasp the dome of the gallbladder and elevated anteriorly and superiorly. Another blunt grasper was placed through the other 5 mm port and used to retract the body and neck of the gallbladder. A dissector was placed through the epigastric port and using the electrocautery the peritoneal reflection at the gallbladder neck was opened. Blunt dissection was then carried out in this area until the gallbladder neck-cystic duct junction was readily identified and a good window was created. A single clip was placed on the gallbladder neck. A small  ductotomy was made just below the clip with laparoscopic scissors. A 14-gauge Angiocath was then placed through the anterior abdominal wall under direct vision. A Reddick cholangiogram catheter was then placed through the Angiocath and flushed. The catheter was then placed in the cystic duct and anchored in place with a clip. A cholangiogram was obtained that showed no filling defects good emptying into the duodenum an adequate length on the cystic duct. The anchoring clip and catheters were then removed from the patient. 3  clips were placed proximally on the cystic duct. The clips did not appear to reach all the way across so an additional endoloop was used to control the cystic duct stump and the duct was divided between the 2 sets of clips. Posterior to this the cystic artery was identified and again  dissected bluntly in a circumferential manner until a good window  was created. 2 clips were placed proximally and one distally on the artery and the artery was divided between the 2 sets of clips. Next a laparoscopic hook cautery device was used to separate the gallbladder from the liver bed. Prior to completely detaching the gallbladder from the liver bed the liver bed was inspected and several small bleeding points were coagulated with the electrocautery until the area was completely hemostatic. The gallbladder was then detached the rest of it from the liver bed without difficulty. A laparoscopic bag was inserted through the hassan port. The laparoscope was moved to the epigastric port. The gallbladder was placed within the bag and the bag was sealed.  The bag with the gallbladder was then removed with the [facility] cannula through the infraumbilical port without difficulty. The fascial defect was then closed with the previously placed Vicryl pursestring stitch as well as with another figure-of-eight 0 Vicryl stitch. The liver bed was inspected again and found to be hemostatic. The abdomen was irrigated with copious amounts of saline until the effluent was clear. The ports were then removed under direct vision without difficulty and were found to be hemostatic. The gas was allowed to escape. The skin incisions were all closed with interrupted 4-0 Monocryl subcuticular stitches. Dermabond dressings were applied. The patient tolerated the procedure well. At the end of the case all needle sponge and instrument counts were correct. The patient was then awakened and taken to recovery in stable condition  PLAN OF CARE: Admit for overnight observation  PATIENT DISPOSITION:  PACU - hemodynamically stable.   Delay start of Pharmacological VTE agent (>24hrs) due to surgical blood loss or risk of bleeding: no

## 2015-07-02 MED ORDER — OXYCODONE-ACETAMINOPHEN 5-325 MG PO TABS: 1.0000 | ORAL_TABLET | ORAL | Status: DC | PRN

## 2015-07-02 NOTE — Discharge Instructions (Signed)
Endoscopic Retrograde Cholangiopancreatography (ERCP), Care After Refer to this sheet in the next few weeks. These instructions provide you with information on caring for yourself after your procedure. Your health care provider may also give you more specific instructions. Your treatment has been planned according to current medical practices, but problems sometimes occur. Call your health care provider if you have any problems or questions after your procedure.  WHAT TO EXPECT AFTER THE PROCEDURE  After your procedure, it is typical to feel:   Soreness in your throat.   Sick to your stomach (nauseous).   Bloated.  Dizzy.   Fatigued. HOME CARE INSTRUCTIONS  Have a friend or family member stay with you for the first 24 hours after your procedure.  Start taking your usual medicines and eating normally as soon as you feel well enough to do so or as directed by your health care provider. SEEK MEDICAL CARE IF:  You have abdominal pain.   You develop signs of infection, such as:   Chills.   Feeling unwell.  SEEK IMMEDIATE MEDICAL CARE IF:  You have difficulty swallowing.  You have worsening throat, chest, or abdominal pain.  You vomit.  You have bloody or very black stools.  You have a fever.   This information is not intended to replace advice given to you by your health care provider. Make sure you discuss any questions you have with your health care provider.   Document Released: 12/17/2012 Document Reviewed: 12/17/2012 Elsevier Interactive Patient Education 2016 [employer]  Laparoscopic Cholecystectomy, Care After Refer to this sheet in the next few weeks. These instructions provide you with information about caring for yourself after your procedure. Your health care provider may also give you more specific instructions. Your treatment has been planned according to current medical practices, but problems sometimes occur. Call your health care provider if you  have any problems or questions after your procedure. WHAT TO EXPECT AFTER THE PROCEDURE After your procedure, it is common to have:  Pain at your incision sites. You will be given pain medicines to control your pain.  Mild nausea or vomiting. This should improve after the first 24 hours.  Bloating and possible shoulder pain from the gas that was used during the procedure. This will improve after the first 24 hours. HOME CARE INSTRUCTIONS Incision Care  Follow instructions from your health care provider about how to take care of your incisions. Make sure you:  Wash your hands with soap and water before you change your bandage (dressing). If soap and water are not available, use hand sanitizer.  Change your dressing as told by your health care provider.  Leave stitches (sutures), skin glue, or adhesive strips in place. These skin closures may need to be in place for 2 weeks or longer. If adhesive strip edges start to loosen and curl up, you may trim the loose edges. Do not remove adhesive strips completely unless your health care provider tells you to do that.  Do not take baths, swim, or use a hot tub until your health care provider approves. Ask your health care provider if you can take showers. You may only be allowed to take sponge baths for bathing. General Instructions  Take over-the-counter and prescription medicines only as told by your health care provider.  Do not drive or operate heavy machinery while taking prescription pain medicine.  Return to your normal diet as told by your health care provider.  Do not lift anything that is heavier than 10  lb (4.5 kg).  Do not play contact sports for one week or until your health care provider approves. SEEK MEDICAL CARE IF:   You have redness, swelling, or pain at the site of your incision.  You have fluid, blood, or pus coming from your incision.  You notice a bad smell coming from your incision area.  Your surgical incisions  break open.  You have a fever. SEEK IMMEDIATE MEDICAL CARE IF:  You develop a rash.  You have difficulty breathing.  You have chest pain.  You have increasing pain in your shoulders (shoulder strap areas).  You faint or have dizzy episodes while you are standing.  You have severe pain in your abdomen.  You have nausea or vomiting that lasts for more than one day.   This information is not intended to replace advice given to you by your health care provider. Make sure you discuss any questions you have with your health care provider.   Document Released: 02/26/2005 Document Revised: 11/17/2014 Document Reviewed: 10/08/2012 Elsevier Interactive Patient Education 2016 [place] ______[employer] LAPAROSCOPIC SURGERY: POST OP INSTRUCTIONS Always review your discharge instruction sheet given to you by the facility where your surgery was performed. IF YOU HAVE DISABILITY OR FAMILY LEAVE FORMS, YOU MUST BRING THEM TO THE OFFICE FOR PROCESSING.   DO NOT GIVE THEM TO YOUR DOCTOR.  1. A prescription for pain medication may be given to you upon discharge.  Take your pain medication as prescribed, if needed.  If narcotic pain medicine is not needed, then you may take acetaminophen (Tylenol) or ibuprofen (Advil) as needed. 2. Take your usually prescribed medications unless otherwise directed. 3. If you need a refill on your pain medication, please contact your pharmacy.  They will contact our office to request authorization. Prescriptions will not be filled after 5pm or on week-ends. 4. You should follow a light diet the first few days after arrival home, such as soup and crackers, etc.  Be sure to include lots of fluids daily. 5. Most patients will experience some swelling and bruising in the area of the incisions.  Ice packs will help.  Swelling and bruising can take several days to resolve.  6. It is common to experience some constipation if taking pain  medication after surgery.  Increasing fluid intake and taking a stool softener (such as Colace) will usually help or prevent this problem from occurring.  A mild laxative (Milk of Magnesia or Miralax) should be taken according to package instructions if there are no bowel movements after 48 hours. 7. Unless discharge instructions indicate otherwise, you may remove your bandages 24-48 hours after surgery, and you may shower at that time.  You may have steri-strips (small skin tapes) in place directly over the incision.  These strips should be left on the skin for 7-10 days.  If your surgeon used skin glue on the incision, you may shower in 24 hours.  The glue will flake off over the next 2-3 weeks.  Any sutures or staples will be removed at the office during your follow-up visit. 8. ACTIVITIES:  You may resume regular (light) daily activities beginning the next day--such as daily self-care, walking, climbing stairs--gradually increasing activities as tolerated.  You may have sexual intercourse when it is comfortable.  Refrain from any heavy lifting or straining until approved by your doctor. a. You may drive when you are no longer taking prescription pain medication, you can comfortably wear a seatbelt, and you  can safely maneuver your car and apply brakes. b. RETURN TO WORK:  __________________________________________________________ 9. You should see your doctor in the office for a follow-up appointment approximately 2-3 weeks after your surgery.  Make sure that you call for this appointment within a day or two after you arrive home to insure a convenient appointment time. 10. OTHER INSTRUCTIONS:   Call our office for an appt with Dr. Marlou Starks in 2 to 3 weeks  WHEN TO CALL YOUR DOCTOR: 1. Fever over 101.0 2. Inability to urinate 3. Continued bleeding from incision. 4. Increased pain, redness, or drainage from the incision. 5. Increasing abdominal pain  The clinic staff is available to answer your  questions during regular business hours.  Please dont hesitate to call and ask to speak to one of the nurses for clinical concerns.  If you have a medical emergency, go to the nearest emergency room or call 911.   A surgeon from [facility] is always on call at the hospital. [address] [place], [place], [place]  [address] ? [address], [place], [place]   [address] Office: [phone] ? [phone] ? FAX [phone] Web site: [email or web address]

## 2015-07-02 NOTE — Discharge Summary (Signed)
Physician Discharge Summary  Tiffany Blair [id number] DOB: 04-30-1985 DOA: 06/28/2015  PCP: [facility] date: 06/28/2015 Discharge date: 07/02/2015  Time spent: 60 minutes    Discharge Condition: stable    Discharge Diagnoses:  Principal Problem:   Calculus of gallbladder and bile duct with obstruction without cholecystitis Active Problems:   Transaminitis   Anemia   History of present illness:  Tiffany Blair is a 30 y.o. female without significant medical history who presented for RUQ pain. She was seen in the ED on 06/25/15 for similar symptoms and was told that she had gallstones. She was sent home with vicoden, however, she notes that this did not control her pain for long. She reports that she has had crampy RUQ abdominal pain after food for a while. The pain would pass, however, and she has not required surgery. She has been seen in the ED as far back as 2016 for this. This time however, she reports sharp, intractable pain in the RUQ, epigastrium and radiating to the LUQ. Worse with food. 10/10. Better with rest and pain medications. Associated symptoms include nausea, vomiting and decreased PO intake; she has had one episode of lightheadedness and burning pain in the chest.  Hospital Course:  Principal Problem:  Calculus of gallbladder and bile duct with obstruction without cholecystitis - s/p ERCP and spincterotomy with removal of stones 4/20 - lap chole on 4/21-surgery has evaluated her today as well- she is stable to go home today  Active Problems:  Transaminitis - due to above- improving  Sore throat and left chest soreness after ERCP - lozenges and Voltaren gel- resolved   Anemia - chronically at 10-11- check anemia panel reveals normal Ferritin, B12 and folate levels  Procedures:  ERCP, lap chole  Consultations:  GI  Gen surgery  Discharge Exam: Filed Weights   06/29/15 0925 06/30/15 0657  Weight: 61.3 kg (135 lb 2.3  oz) 61.236 kg (135 lb)   Filed Vitals:   07/01/15 2111 07/02/15 0545  BP: 103/64 95/58  Pulse: 53 56  Temp: 98.2 F (36.8 C) 97.6 F (36.4 C)  Resp: 12 16    General: AAO x 3, no distress Cardiovascular: RRR, no murmurs  Respiratory: clear to auscultation bilaterally GI: soft, non-tender, non-distended, bowel sound positive  Discharge Instructions You were cared for by a hospitalist during your hospital stay. If you have any questions about your discharge medications or the care you received while you were in the hospital after you are discharged, you can call the unit and asked to speak with the hospitalist on call if the hospitalist that took care of you is not available. Once you are discharged, your primary care physician will handle any further medical issues. Please note that NO REFILLS for any discharge medications will be authorized once you are discharged, as it is imperative that you return to your primary care physician (or establish a relationship with a primary care physician if you do not have one) for your aftercare needs so that they can reassess your need for medications and monitor your lab values.      Discharge Instructions    Diet - low sodium heart healthy    Complete by:  As directed      Discharge instructions    Complete by:  As directed   Low fat diet     Increase activity slowly    Complete by:  As directed      Increase activity slowly  Complete by:  As directed             Medication List    STOP taking these medications        HYDROcodone-acetaminophen 5-325 MG tablet  Commonly known as:  NORCO/VICODIN      TAKE these medications        acetaminophen 500 MG tablet  Commonly known as:  TYLENOL  Take 1,000 mg by mouth every 8 (eight) hours as needed for mild pain or headache. pain     oxyCODONE-acetaminophen 5-325 MG tablet  Commonly known as:  PERCOCET/ROXICET  Take 1-2 tablets by mouth every 4 (four) hours as needed for moderate  pain.       No Known Allergies Follow-up Information    Follow up with [facility] On 07/20/2015.   Specialty:  General Surgery   Why:  Your appointment is at 12:00 noon, be at the office 30 minutes early for check in.   Contact information:   [address] [place] [address] [phone]        The results of significant diagnostics from this hospitalization (including imaging, microbiology, ancillary and laboratory) are listed below for reference.    Significant Diagnostic Studies: Dg Chest 2 View  06/25/2015  CLINICAL DATA:  30 year old female with central chest pain spreading bilaterally EXAM: CHEST  2 VIEW COMPARISON:  Prior chest x-ray 04/09/2014 FINDINGS: The lungs are clear and negative for focal airspace consolidation, pulmonary edema or suspicious pulmonary nodule. No pleural effusion or pneumothorax. Cardiac and mediastinal contours are within normal limits. No acute fracture or lytic or blastic osseous lesions. The visualized upper abdominal bowel gas pattern is unremarkable. IMPRESSION: Normal chest x-ray Electronically Signed   By: Jacqulynn Cadet M.D.   On: 06/25/2015 15:09   Dg Cholangiogram Operative  07/01/2015  CLINICAL DATA:  Cholecystectomy for cholelithiasis and choledocholithiasis. EXAM: INTRAOPERATIVE CHOLANGIOGRAM TECHNIQUE: Cholangiographic images from the C-arm fluoroscopic device were submitted for interpretation post-operatively. Please see the procedural report for the amount of contrast and the fluoroscopy time utilized. COMPARISON:  Ultrasound on 06/29/2015 and ERCP images on 06/30/2015. FINDINGS: Intraoperative cholangiogram shows some residual dilatation of the common bile duct. No filling defects are identified. Contrast enters the duodenum. No contrast extravasation. IMPRESSION: No evidence of biliary filling defects by intraoperative cholangiogram. Electronically Signed   By: Aletta Edouard M.D.   On: 07/01/2015 16:18   Dg  Ercp Biliary & Pancreatic Ducts  06/30/2015  CLINICAL DATA:  30 year old female with a history of choledocholithiasis EXAM: ERCP TECHNIQUE: Multiple spot images obtained with the fluoroscopic device and submitted for interpretation post-procedure. FLUOROSCOPY TIME:  Fluoroscopy Time:  3 minutes 37 seconds COMPARISON:  None FINDINGS: Limited fluoroscopic spot images during ERCP. Initial images demonstrate endoscope over the upper abdomen with cannulation of the ampulla. Infusion of contrast into the extrahepatic biliary system with partial opacification. Partial opacification of the intrahepatic ducts. No plastic stent at the conclusion of the case. IMPRESSION: Limited images during ERCP demonstrate partial opacification of the extrahepatic biliary ducts. Please refer to the dictated operative report for full details of intraoperative findings and procedure. Signed, Dulcy Fanny. Earleen Newport, DO [facility] Electronically Signed   By: Corrie Mckusick D.O.   On: 06/30/2015 09:08   US Abdomen Limited Ruq  06/29/2015  CLINICAL DATA:  Right upper quadrant pain starting last night. Known gallstones. EXAM: US ABDOMEN LIMITED - RIGHT UPPER QUADRANT COMPARISON:  06/25/2015 FINDINGS: Gallbladder: Multiple small stones  layering in the dependent portion of the gallbladder. Largest stones measure about 6 mm diameter. No gallbladder wall thickening. Murphy's sign is negative. Small polyp is again demonstrated along the nondependent surface measuring 5 mm consistent with benign lesion. Common bile duct: Diameter: 10 mm, mildly dilated. A stone is demonstrated in the mid/distal common bile duct measuring 6 mm in diameter. Liver: Mild intrahepatic bile duct dilatation is present. No focal liver lesions. Parenchymal echotexture is homogeneous. IMPRESSION: Cholelithiasis. No additional changes to suggest cholecystitis. Choledocholithiasis with mild dilatation of intra and extrahepatic  bile ducts. Electronically Signed   By: Lucienne Capers M.D.   On: 06/29/2015 06:19   US Abdomen Limited Ruq  06/25/2015  CLINICAL DATA:  Acute onset of generalized abdominal and back pain for 1 day. Initial encounter. EXAM: US ABDOMEN LIMITED - RIGHT UPPER QUADRANT COMPARISON:  Abdominal ultrasound performed 04/07/2009 FINDINGS: Gallbladder: Stones are noted dependently within the gallbladder. A 5 mm polyp is also noted along the gallbladder wall. No gallbladder wall thickening or pericholecystic fluid is seen. No ultrasonographic Murphy's sign is elicited. Common bile duct: Diameter: 0.7 cm, borderline prominent. Liver: No focal lesion identified. Within normal limits in parenchymal echogenicity. IMPRESSION: 1. Persistent mild distention of the common bile duct, similar in appearance to 2011, which may simply reflect the patient's baseline or could reflect a distal obstructing stone, depending on the patient's symptoms. Would correlate with the patient's presentation and subsequent findings in 2011. MRCP could be considered for further evaluation, when and if deemed clinically appropriate. 2. Cholelithiasis.  No evidence for cholecystitis. 3. 5 mm polyp noted along the gallbladder wall. Electronically Signed   By: Garald Balding M.D.   On: 06/25/2015 19:43    Microbiology: Recent Results (from the past 240 hour(s))  Urine culture     Status: None   Collection Time: 06/29/15  9:26 AM  Result Value Ref Range Status   Specimen Description URINE, CLEAN CATCH  Final   Special Requests NONE  Final   Culture MULTIPLE SPECIES PRESENT, SUGGEST RECOLLECTION  Final   Report Status 06/30/2015 FINAL  Final  Surgical pcr screen     Status: None   Collection Time: 06/30/15  5:58 PM  Result Value Ref Range Status   MRSA, PCR NEGATIVE NEGATIVE Final   Staphylococcus aureus NEGATIVE NEGATIVE Final    Comment:        The Xpert SA Assay (FDA approved for NASAL specimens in patients over 60 years of age), is  one component of a comprehensive surveillance program.  Test performance has been validated by [facility] for patients greater than or equal to 4 year old. It is not intended to diagnose infection nor to guide or monitor treatment.      Labs: Basic Metabolic Panel:  Recent Labs Lab 06/25/15 1435 06/29/15 0326 06/30/15 0514 07/01/15 0521  NA 138 139 142 140  K 3.9 4.2 4.0 3.7  CL 105 103 109 103  CO2 24 27 26 26   GLUCOSE 101* 109* 96 99  BUN 11 12 8  <5*  CREATININE 0.79 0.71 0.74 0.65  CALCIUM 9.2 8.9 8.7* 8.9   Liver Function Tests:  Recent Labs Lab 06/29/15 0326 06/30/15 0514 07/01/15 0521  AST 415* 191* 82*  ALT 293* 252* 181*  ALKPHOS 117 109 98  BILITOT 1.9* 1.1 0.8  PROT 7.4 6.9 6.4*  ALBUMIN 4.1 3.5 3.4*    Recent Labs Lab 06/25/15 2022 06/29/15 0326 07/01/15 0521  LIPASE 18 19 17    No  results for input(s): AMMONIA in the last 168 hours. CBC:  Recent Labs Lab 06/25/15 1435 06/29/15 0326 07/01/15 0521  WBC 7.2 6.8 3.8*  NEUTROABS  --  5.1 1.6*  HGB 12.8 11.7* 10.5*  HCT 39.0 35.5* 32.0*  MCV 83.5 86.0 84.9  PLT 256 252 226   Cardiac Enzymes: No results for input(s): CKTOTAL, CKMB, CKMBINDEX, TROPONINI in the last 168 hours. BNP: BNP (last 3 results) No results for input(s): BNP in the last 8760 hours.  ProBNP (last 3 results) No results for input(s): PROBNP in the last 8760 hours.  CBG:  Recent Labs Lab 07/01/15 1039  GLUCAP 86       SignedDebbe Odea, MD [facility] 07/02/2015, 10:35 AM

## 2015-07-02 NOTE — Progress Notes (Signed)
[facility] Office:  [phone] General Surgery Progress Note   LOS: 3 days  POD -  1 Day Post-Op  Assessment/Plan: 1.  LAPAROSCOPIC CHOLECYSTECTOMY WITH INTRAOPERATIVE CHOLANGIOGRAM - 07/01/2015 - Tiffany Blair  She is doing well from a surgery standpoint and can go home  I spoke to the mother on the phone.  The mother wants to go to church tomorrow and wants her daughter to stay for that reason (as best I can tell)  Ms. Mormando needs follow up with Dr. Marlou Blair in 2 to 3 weeks for follow up exam.  2.  ERCP - 05/02/2015 - [place] 3.  DVT prophylaxis - SQ heparin   Principal Problem:   Calculus of gallbladder and bile duct with obstruction without cholecystitis Active Problems:   Transaminitis   Anemia   Subjective:  Sore, but wants more to eat.  Objective:   Filed Vitals:   07/01/15 2111 07/02/15 0545  BP: 103/64 95/58  Pulse: 53 56  Temp: 98.2 F (36.8 C) 97.6 F (36.4 C)  Resp: 12 16     Intake/Output from previous day:  04/21 0701 - 04/22 0700 In: 1750 [I.V.:1750] Out: 210 [Urine:200; Blood:10]  Intake/Output this shift:      Physical Exam:   General: WN AA F who is alert and oriented.    HEENT: Normal. Pupils equal. .   Lungs: Clear.   Abdomen: Soft.  Has BS.   Wound: Clean   Lab Results:    Recent Labs  07/01/15 0521  WBC 3.8*  HGB 10.5*  HCT 32.0*  PLT 226    BMET   Recent Labs  06/30/15 0514 07/01/15 0521  NA 142 140  K 4.0 3.7  CL 109 103  CO2 26 26  GLUCOSE 96 99  BUN 8 <5*  CREATININE 0.74 0.65  CALCIUM 8.7* 8.9    PT/INR  No results for input(s): LABPROT, INR in the last 72 hours.  ABG  No results for input(s): PHART, HCO3 in the last 72 hours.  Invalid input(s): PCO2, PO2   Studies/Results:  Dg Cholangiogram Operative  07/01/2015  CLINICAL DATA:  Cholecystectomy for cholelithiasis and choledocholithiasis. EXAM: INTRAOPERATIVE CHOLANGIOGRAM TECHNIQUE: Cholangiographic images from the C-arm fluoroscopic device were  submitted for interpretation post-operatively. Please see the procedural report for the amount of contrast and the fluoroscopy time utilized. COMPARISON:  Ultrasound on 06/29/2015 and ERCP images on 06/30/2015. FINDINGS: Intraoperative cholangiogram shows some residual dilatation of the common bile duct. No filling defects are identified. Contrast enters the duodenum. No contrast extravasation. IMPRESSION: No evidence of biliary filling defects by intraoperative cholangiogram. Electronically Signed   By: Aletta Edouard M.D.   On: 07/01/2015 16:18     Anti-infectives:   Anti-infectives    Start     Dose/Rate Route Frequency Ordered Stop   07/01/15 0600  ceFAZolin (ANCEF) IVPB 2g/100 mL premix     2 g 200 mL/hr over 30 Minutes Intravenous On call to O.R. 06/30/15 1023 07/01/15 1513   06/30/15 0600  cefTRIAXone (ROCEPHIN) 1 g in dextrose 5 % 50 mL IVPB     1 g 100 mL/hr over 30 Minutes Intravenous  Once 06/29/15 1301 06/30/15 0543      Alphonsa Overall, MD, FACS Pager: [facility] Office: [phone] 07/02/2015

## 2015-07-02 NOTE — Progress Notes (Signed)
Pt leaving at this time with her mother. Alert, oriented, and without c/o. Discharge instructions/prescriptions given/explained with pt verbalizing understanding. Followup appointment noted. Pt left with prescription, cell phone, clothes, bag.

## 2015-07-04 ENCOUNTER — Encounter (HOSPITAL_COMMUNITY): Payer: Self-pay | Admitting: General Surgery

## 2015-07-05 ENCOUNTER — Encounter (HOSPITAL_COMMUNITY): Payer: Self-pay | Admitting: Gastroenterology

## 2015-12-11 ENCOUNTER — Emergency Department (HOSPITAL_COMMUNITY)
Admission: EM | Admit: 2015-12-11 | Discharge: 2015-12-11 | Disposition: A | Discharge status: Home / Self Care | Payer: Medicaid Other | Attending: Emergency Medicine | Admitting: Emergency Medicine

## 2015-12-11 ENCOUNTER — Encounter (HOSPITAL_COMMUNITY): Payer: Self-pay

## 2015-12-11 DIAGNOSIS — Z79899 Other long term (current) drug therapy: Secondary | ICD-10-CM | POA: Insufficient documentation

## 2015-12-11 DIAGNOSIS — Z87891 Personal history of nicotine dependence: Secondary | ICD-10-CM | POA: Insufficient documentation

## 2015-12-11 DIAGNOSIS — M546 Pain in thoracic spine: Secondary | ICD-10-CM | POA: Insufficient documentation

## 2015-12-11 MED ORDER — CYCLOBENZAPRINE HCL 10 MG PO TABS: 10.0000 mg | ORAL_TABLET | Freq: Two times a day (BID) | ORAL | 0 refills | Status: DC | PRN

## 2015-12-11 MED ORDER — NAPROXEN 500 MG PO TABS
500.0000 mg | ORAL_TABLET | Freq: Once | ORAL | Status: AC
  Administered 2015-12-11: 500 mg via ORAL
  Filled 2015-12-11: qty 1

## 2015-12-11 MED ORDER — NAPROXEN 500 MG PO TABS: 500.0000 mg | ORAL_TABLET | Freq: Two times a day (BID) | ORAL | 0 refills | Status: DC

## 2015-12-11 NOTE — Discharge Instructions (Signed)
Take medication as prescribed. Follow up with your primary care provider this week. Return to the ER for new or worsening symptoms.

## 2015-12-11 NOTE — ED Provider Notes (Signed)
[facility] DEPT Provider Note   CSN: [id number] Arrival date & time: 12/11/15  1819  By signing my name below, I, [name], attest that this documentation has been prepared under the direction and in the presence of Gwenna Fuston Y. Garek Schuneman, PA-C Electronically Signed: [name], ED Scribe. 12/11/15. 6:42 PM.   History   Chief Complaint Chief Complaint  Patient presents with  . Back Pain    HPI  Tiffany Blair is a 30 y.o. female  who presents to the Emergency Department complaining of upper back pain onset this morning. Pt reports that her upper back pain may be contributed to sleeping incorrectly last night. She reports that her upper back pain does radiate to her mid back. Pt upper back pain is worsened with tilting her head and lifting her arms above her head. Pt denies alleviating factors for her upper back pain. Pt denies any heavy lifting, recent injury, trauma, or fall prior to the onset of her upper back pain. She states that she has tried ibuprofen, icy hot patch, hot shower, and warm compress, with no relief for her symptoms. Pt denies numbness, tingling, leg pain, leg swelling, color change, rash, wound, and any other symptoms. Pt denies recent travel, immobilization, or birth control use. Denies OCP use. Pt states that she does have a PCP.  The history is provided by the patient. No language interpreter was used.    Past Medical History:  Diagnosis Date  . Medical history non-contributory   . Pregnancy     Patient Active Problem List   Diagnosis Date Noted  . Anemia 06/30/2015  . Transaminitis 06/29/2015  . Calculus of gallbladder and bile duct with obstruction without cholecystitis     Past Surgical History:  Procedure Laterality Date  . CHOLECYSTECTOMY N/A 07/01/2015   Procedure: LAPAROSCOPIC CHOLECYSTECTOMY WITH INTRAOPERATIVE CHOLANGIOGRAM;  Surgeon: Autumn Messing III, MD;  Location: [facility];  Service: General;  Laterality: N/A;  . ERCP N/A 06/30/2015   Procedure:  ENDOSCOPIC RETROGRADE CHOLANGIOPANCREATOGRAPHY (ERCP);  Surgeon: Clarene Essex, MD;  Location: [facility];  Service: Endoscopy;  Laterality: N/A;  . WISDOM TOOTH EXTRACTION      OB History    Gravida Para Term Preterm AB Living   2 1 1   1 1    SAB TAB Ectopic Multiple Live Births           1       Home Medications    Prior to Admission medications   Medication Sig Start Date End Date Taking? Authorizing Provider  acetaminophen (TYLENOL) 500 MG tablet Take 1,000 mg by mouth every 8 (eight) hours as needed for mild pain or headache. pain    Historical Provider, MD  oxyCODONE-acetaminophen (PERCOCET/ROXICET) 5-325 MG tablet Take 1-2 tablets by mouth every 4 (four) hours as needed for moderate pain. 07/02/15   Debbe Odea, MD    Family History Family History  Problem Relation Age of Onset  . Colon cancer Neg Hx   . Cholelithiasis Neg Hx   . Diabetes Neg Hx   . Hypertension Neg Hx     Social History Social History  Substance Use Topics  . Smoking status: Former Research scientist (life sciences)  . Smokeless tobacco: Never Used  . Alcohol use Yes     Comment: occ     Allergies   Review of patient's allergies indicates no known allergies.   Review of Systems Review of Systems  Cardiovascular: Negative for leg swelling.  Musculoskeletal: Positive for back pain (upper to mid). Negative  for arthralgias.  Skin: Negative for color change, rash and wound.  Neurological: Negative for numbness.       No tingling  All other systems reviewed and are negative.    Physical Exam Updated Vital Signs BP 109/68 (BP Location: Left Arm)   Pulse 70   Temp 98.3 F (36.8 C) (Oral)   Resp 16   LMP 11/13/2015   SpO2 100%   Physical Exam  Constitutional: She is oriented to person, place, and time. She appears well-developed and well-nourished. No distress.  HENT:  Head: Normocephalic and atraumatic.  Eyes: EOM are normal.  Neck: Neck supple.  Cardiovascular: Normal rate, regular rhythm and normal heart  sounds.  Exam reveals no gallop and no friction rub.   No murmur heard. Pulmonary/Chest: Effort normal and breath sounds normal. No respiratory distress. She has no wheezes. She has no rales.  Abdominal: She exhibits no distension.  Musculoskeletal: Normal range of motion. She exhibits no edema or tenderness.       Cervical back: She exhibits no tenderness and no bony tenderness.       Thoracic back: Normal. She exhibits no tenderness and no bony tenderness.       Lumbar back: Normal. She exhibits no tenderness and no bony tenderness.       Right lower leg: She exhibits no edema.       Left lower leg: She exhibits no edema.  No significant midline spinal tenderness. No paraspinal tenderness. No LE edema. Ambulatory with steady gait.  Neurological: She is alert and oriented to person, place, and time. Gait normal.  Skin: Skin is warm and dry.  Psychiatric: She has a normal mood and affect. Her behavior is normal.  Nursing note and vitals reviewed.    ED Treatments / Results  DIAGNOSTIC STUDIES: Oxygen Saturation is 100% on RA, nl by my interpretation.    COORDINATION OF CARE: 6:39 PM Discussed treatment plan with pt at bedside which includes naprosyn, naprosyn Rx, flexeril Rx, follow up with PCP, and pt agreed to plan.   Procedures Procedures (including critical care time)  Medications Ordered in ED Medications - No data to display   Initial Impression / Assessment and Plan / ED Course  I have reviewed the triage vital signs and the nursing notes.   Clinical Course     Patient with back pain.  No neurological deficits and normal neuro exam.  Patient is ambulatory.  No loss of bowel or bladder control.  No concern for cauda equina.  No fever, night sweats, weight loss, h/o cancer, IVDA, no recent procedure to back. No urinary symptoms suggestive of UTI.  No risk factors for PE. PERC 0. Will treat with naprosyn while in the ED. Pt will be discharged home with naprosyn Rx and  flexeril Rx. Supportive care and return precaution discussed. Appears safe for discharge at this time. Follow up as indicated in discharge paperwork.   Final Clinical Impressions(s) / ED Diagnoses   Final diagnoses:  Acute bilateral thoracic back pain    New Prescriptions Discharge Medication List as of 12/11/2015  6:40 PM    START taking these medications   Details  cyclobenzaprine (FLEXERIL) 10 MG tablet Take 1 tablet (10 mg total) by mouth 2 (two) times daily as needed., Starting Sun 12/11/2015, Print    naproxen (NAPROSYN) 500 MG tablet Take 1 tablet (500 mg total) by mouth 2 (two) times daily., Starting Sun 12/11/2015, Print        I personally performed  the services described in this documentation, which was scribed in my presence. The recorded information has been reviewed and is accurate.    Anne Ng, PA-C 12/11/15 1902    Fredia Sorrow, MD 12/14/15 [phone]

## 2015-12-11 NOTE — Progress Notes (Signed)
Pt woke up this am with upper back pain. She states the pain is a 8/10. Pt does appear  to guard her neck and appears very stiff. Denies any injury.

## 2015-12-11 NOTE — ED Triage Notes (Signed)
Pt c/o waking up w/ upper back pain this morning.  Pain score 8/10.  Pt reports taking ibuprofen w/o relief.  Denies injury.  Denies new exercises/lifting/pulling.

## 2016-07-15 ENCOUNTER — Emergency Department (HOSPITAL_COMMUNITY)
Admission: EM | Admit: 2016-07-15 | Discharge: 2016-07-15 | Disposition: A | Discharge status: Home / Self Care | Payer: Medicaid Other | Attending: Emergency Medicine | Admitting: Emergency Medicine

## 2016-07-15 ENCOUNTER — Encounter (HOSPITAL_COMMUNITY): Payer: Self-pay | Admitting: Emergency Medicine

## 2016-07-15 DIAGNOSIS — Z79899 Other long term (current) drug therapy: Secondary | ICD-10-CM | POA: Insufficient documentation

## 2016-07-15 DIAGNOSIS — Z87891 Personal history of nicotine dependence: Secondary | ICD-10-CM | POA: Insufficient documentation

## 2016-07-15 DIAGNOSIS — R21 Rash and other nonspecific skin eruption: Secondary | ICD-10-CM | POA: Insufficient documentation

## 2016-07-15 MED ORDER — PREDNISONE 10 MG PO TABS: 40.0000 mg | ORAL_TABLET | Freq: Every day | ORAL | 0 refills | Status: AC

## 2016-07-15 MED ORDER — HYDROCORTISONE 1 % EX CREA: TOPICAL_CREAM | CUTANEOUS | 0 refills | Status: DC

## 2016-07-15 NOTE — ED Provider Notes (Signed)
[facility] DEPT Provider Note   CSN: [id number] Arrival date & time: 07/15/16  1543  By signing my name below, I, Sonum Patel, attest that this documentation has been prepared under the direction and in the presence of Wauneta Silveria, PA-C.Marland Kitchen Electronically Signed: Sonum Patel, Education administrator. 07/15/16. 4:39 PM.  History   Chief Complaint Chief Complaint  Patient presents with  . Rash    The history is provided by the patient. No language interpreter was used.     HPI Comments: Tiffany Blair is a 31 y.o. female who presents to the Emergency Department complaining of 1 week of gradual onset, constant, gradually worsened pruritic rash that started on her lower legs and now has spread to her torso and upper extremities. She denies changes in body hygiene products or detergents. She denies contact with the woods/plants. She notes associated burning sensations. She has applied alcohol without relief. She denies SOB, lip swelling, rhinorrhea, watery eyes. She denies sick contacts with similar symptoms.   Past Medical History:  Diagnosis Date  . Medical history non-contributory   . Pregnancy     Patient Active Problem List   Diagnosis Date Noted  . Anemia 06/30/2015  . Transaminitis 06/29/2015  . Calculus of gallbladder and bile duct with obstruction without cholecystitis     Past Surgical History:  Procedure Laterality Date  . CHOLECYSTECTOMY N/A 07/01/2015   Procedure: LAPAROSCOPIC CHOLECYSTECTOMY WITH INTRAOPERATIVE CHOLANGIOGRAM;  Surgeon: Autumn Messing III, MD;  Location: [facility];  Service: General;  Laterality: N/A;  . ERCP N/A 06/30/2015   Procedure: ENDOSCOPIC RETROGRADE CHOLANGIOPANCREATOGRAPHY (ERCP);  Surgeon: Clarene Essex, MD;  Location: [facility];  Service: Endoscopy;  Laterality: N/A;  . WISDOM TOOTH EXTRACTION      OB History    Gravida Para Term Preterm AB Living   2 1 1   1 1    SAB TAB Ectopic Multiple Live Births           1       Home Medications    Prior to  Admission medications   Medication Sig Start Date End Date Taking? Authorizing Provider  acetaminophen (TYLENOL) 500 MG tablet Take 1,000 mg by mouth every 8 (eight) hours as needed for mild pain or headache. pain    [provider]  cyclobenzaprine (FLEXERIL) 10 MG tablet Take 1 tablet (10 mg total) by mouth 2 (two) times daily as needed. 12/11/15   Sam, Olivia Canter, PA-C  hydrocortisone cream 1 % Apply to affected area 2 times daily 07/15/16   Jackalyn Haith, PA-C  naproxen (NAPROSYN) 500 MG tablet Take 1 tablet (500 mg total) by mouth 2 (two) times daily. 12/11/15   Sam, Olivia Canter, PA-C  oxyCODONE-acetaminophen (PERCOCET/ROXICET) 5-325 MG tablet Take 1-2 tablets by mouth every 4 (four) hours as needed for moderate pain. 07/02/15   Debbe Odea, MD  predniSONE (DELTASONE) 10 MG tablet Take 4 tablets (40 mg total) by mouth daily. 07/15/16 07/19/16  Delia Heady, PA-C    Family History Family History  Problem Relation Age of Onset  . Colon cancer Neg Hx   . Cholelithiasis Neg Hx   . Diabetes Neg Hx   . Hypertension Neg Hx     Social History Social History  Substance Use Topics  . Smoking status: Former Research scientist (life sciences)  . Smokeless tobacco: Never Used  . Alcohol use Yes     Comment: occ     Allergies   Patient has no known allergies.   Review of Systems Review of Systems  HENT: Negative for facial swelling and rhinorrhea.   Eyes: Negative for discharge.  Respiratory: Negative for shortness of breath.   Skin: Positive for rash.     Physical Exam Updated Vital Signs BP 96/61 (BP Location: Right Arm)   Pulse 77   Temp 99 F (37.2 C) (Oral)   Resp 16   SpO2 100%   Physical Exam  Constitutional: She appears well-developed and well-nourished. No distress.  HENT:  Head: Normocephalic and atraumatic.  Eyes: Conjunctivae and EOM are normal. No scleral icterus.  Neck: Normal range of motion.  Pulmonary/Chest: Effort normal. No respiratory distress.  Neurological: She is alert.    Skin: No rash noted. She is not diaphoretic.  Small papular bumps noted on bilateral ankles and upper arms. No hives or erythema noted. No signs of excoriation. One similar bump noted on ring finger of L hand.  Psychiatric: She has a normal mood and affect.  Nursing note and vitals reviewed.    ED Treatments / Results  DIAGNOSTIC STUDIES: Oxygen Saturation is 100% on RA, normal by my interpretation.    COORDINATION OF CARE: 4:36 PM Discussed treatment plan with pt at bedside and pt agreed to plan.   Labs (all labs ordered are listed, but only abnormal results are displayed) Labs Reviewed - No data to display  EKG  EKG Interpretation None       Radiology No results found.  Procedures Procedures (including critical care time)  Medications Ordered in ED Medications - No data to display   Initial Impression / Assessment and Plan / ED Course  I have reviewed the triage vital signs and the nursing notes.  Pertinent labs & imaging results that were available during my care of the patient were reviewed by me and considered in my medical decision making (see chart for details).     Patient's history and symptoms concerning for contact dermatitis versus scabies. Patient's rash is diffuse on upper and lower extremities although no urticaria or signs of angioedema present. No contacts with similar symptoms at home. She denies any new recent exposures however could be anything that caused her symptoms. She has no history of allergies in the past and no history of symptoms like this prior. Rash appears benign. Will treat with prednisone burst and cortisone cream to apply in the effected areas. Patient was encouraged to obtain PCP for further evaluation and possible allergy testing. Strict return precautions given.  Final Clinical Impressions(s) / ED Diagnoses   Final diagnoses:  Rash    New Prescriptions Discharge Medication List as of 07/15/2016  4:50 PM    START taking these  medications   Details  hydrocortisone cream 1 % Apply to affected area 2 times daily, Print    predniSONE (DELTASONE) 10 MG tablet Take 4 tablets (40 mg total) by mouth daily., Starting Sun 07/15/2016, Until Thu 07/19/2016, Print       I personally performed the services described in this documentation, which was scribed in my presence. The recorded information has been reviewed and is accurate.    Delia Heady, PA-C 07/15/16 1805    Margette Fast, MD 07/16/16 1150

## 2016-07-15 NOTE — Discharge Instructions (Signed)
Take prednisone 40 mg for 4 days. Apply hydrocortisone cream to affected areas as directed. Follow-up with PCP for further evaluation and possible skin testing if needed. Return to ED for Worsening symptoms, trouble breathing, trouble swallowing, lip swelling, no improvement in symptoms.

## 2016-07-15 NOTE — ED Triage Notes (Signed)
Pt reports generalized itchy rash since yesterday. Initially had swelling associated with itchy spots, but this has decreased since then.

## 2016-07-15 NOTE — ED Notes (Signed)
Pt ambulatory and independent at discharge.  Verbalized understanding of discharge instructions 

## 2017-04-26 IMAGING — CR DG CHEST 2V
2 series · 2 of 2 positions shown · non-contrast
Comparison: Prior chest x-ray 04/09/2014

CLINICAL DATA: 29-year-old female with central chest pain spreading
bilaterally

EXAM:
CHEST  2 VIEW

[w chest pa]
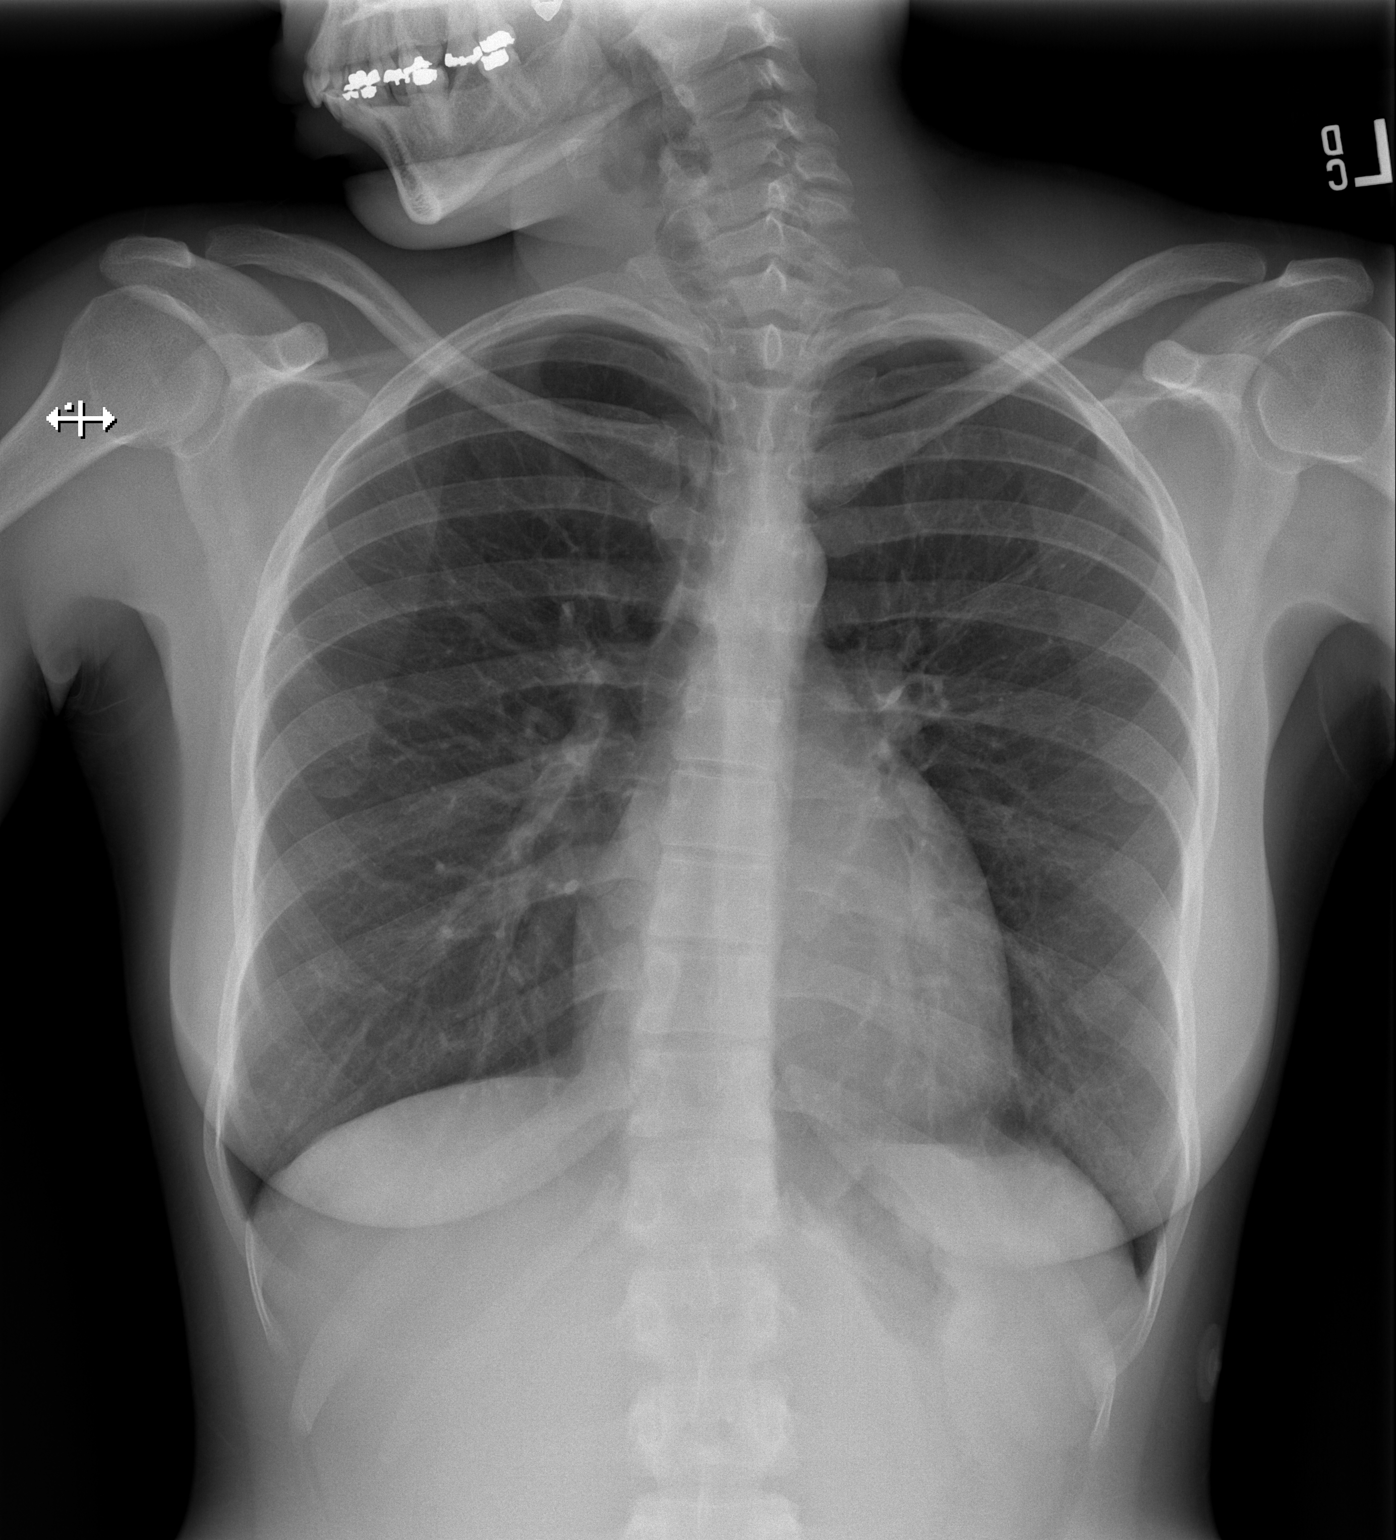

[w chest lat]
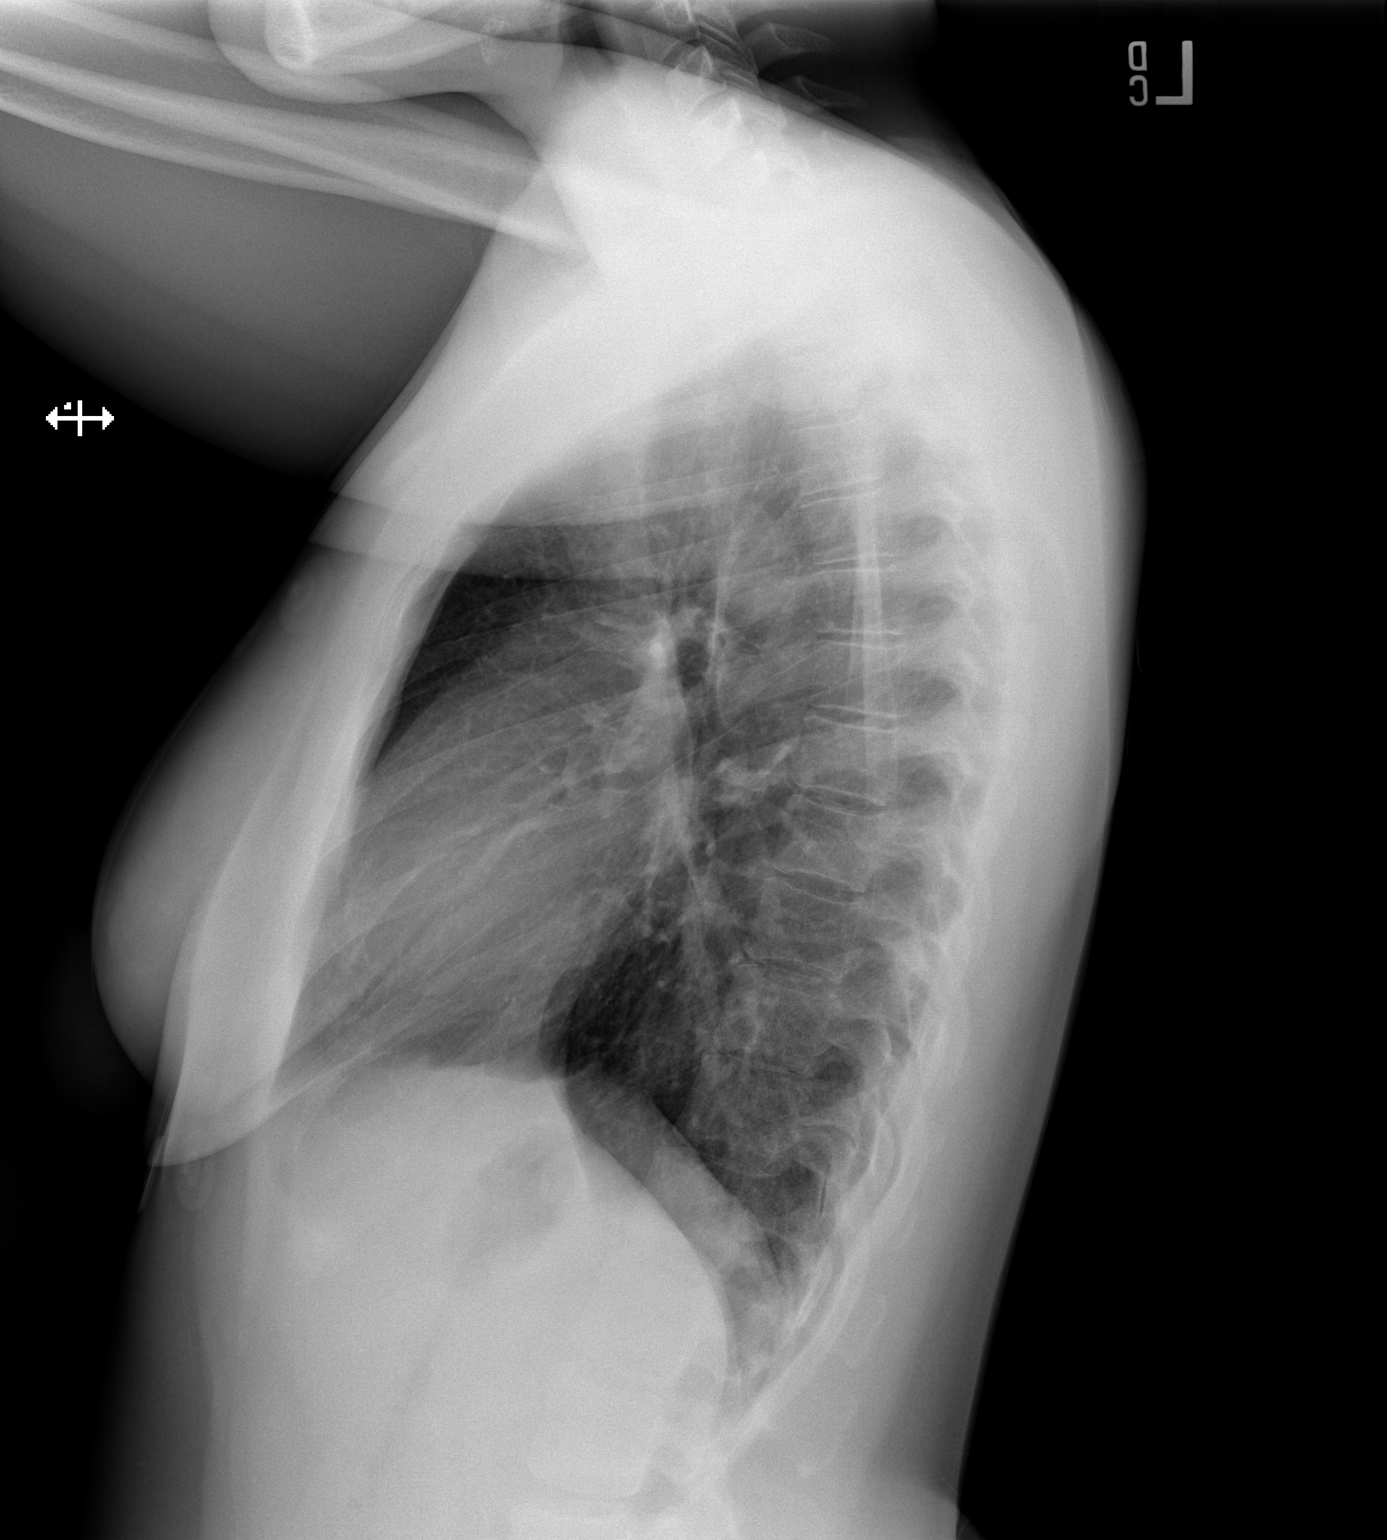

[2 of 2 positions shown; findings below may reference images not displayed]

FINDINGS: The lungs are clear and negative for focal airspace consolidation,
pulmonary edema or suspicious pulmonary nodule. No pleural effusion
or pneumothorax. Cardiac and mediastinal contours are within normal
limits. No acute fracture or lytic or blastic osseous lesions. The
visualized upper abdominal bowel gas pattern is unremarkable.
IMPRESSION: Normal chest x-ray

## 2018-06-11 ENCOUNTER — Emergency Department (HOSPITAL_COMMUNITY): Payer: Medicaid Other

## 2018-06-11 ENCOUNTER — Other Ambulatory Visit: Payer: Self-pay

## 2018-06-11 ENCOUNTER — Emergency Department (HOSPITAL_COMMUNITY)
Admission: EM | Admit: 2018-06-11 | Discharge: 2018-06-11 | Disposition: A | Discharge status: Home / Self Care | Payer: Medicaid Other | Attending: Emergency Medicine | Admitting: Emergency Medicine

## 2018-06-11 ENCOUNTER — Encounter (HOSPITAL_COMMUNITY): Payer: Self-pay | Admitting: Emergency Medicine

## 2018-06-11 DIAGNOSIS — J111 Influenza due to unidentified influenza virus with other respiratory manifestations: Secondary | ICD-10-CM

## 2018-06-11 DIAGNOSIS — Z79899 Other long term (current) drug therapy: Secondary | ICD-10-CM | POA: Diagnosis not present

## 2018-06-11 DIAGNOSIS — M791 Myalgia, unspecified site: Secondary | ICD-10-CM | POA: Diagnosis present

## 2018-06-11 DIAGNOSIS — K529 Noninfective gastroenteritis and colitis, unspecified: Secondary | ICD-10-CM

## 2018-06-11 DIAGNOSIS — Z87891 Personal history of nicotine dependence: Secondary | ICD-10-CM | POA: Diagnosis not present

## 2018-06-11 DIAGNOSIS — R69 Illness, unspecified: Secondary | ICD-10-CM

## 2018-06-11 LAB — COMPREHENSIVE METABOLIC PANEL
ALT: 16 U/L (ref 0–44)
AST: 19 U/L (ref 15–41)
Albumin: 3.9 g/dL (ref 3.5–5.0)
Alkaline Phosphatase: 51 U/L (ref 38–126)
Anion gap: 6 (ref 5–15)
BUN: 14 mg/dL (ref 6–20)
CO2: 25 mmol/L (ref 22–32)
Calcium: 8.7 mg/dL — ABNORMAL LOW (ref 8.9–10.3)
Chloride: 103 mmol/L (ref 98–111)
Creatinine, Ser: 0.76 mg/dL (ref 0.44–1.00)
GFR calc Af Amer: >60 mL/min (ref >60)
GFR calc non Af Amer: >60 mL/min (ref >60)
Glucose, Bld: 99 mg/dL (ref 70–99)
Potassium: 3.7 mmol/L (ref 3.5–5.1)
Sodium: 134 mmol/L — ABNORMAL LOW (ref 135–145)
Total Bilirubin: 0.7 mg/dL (ref 0.3–1.2)
Total Protein: 7.7 g/dL (ref 6.5–8.1)

## 2018-06-11 LAB — CBC WITH DIFFERENTIAL/PLATELET
Abs Immature Granulocytes: 0.04 10*3/uL (ref 0.00–0.07)
Basophils Absolute: 0 10*3/uL (ref 0.0–0.1)
Basophils Relative: 0 %
Eosinophils Absolute: 0.1 10*3/uL (ref 0.0–0.5)
Eosinophils Relative: 1 %
HCT: 35.4 % — ABNORMAL LOW (ref 36.0–46.0)
Hemoglobin: 11.1 g/dL — ABNORMAL LOW (ref 12.0–15.0)
Immature Granulocytes: 0 %
Lymphocytes Relative: 10 %
Lymphs Abs: 0.9 10*3/uL (ref 0.7–4.0)
MCH: 27.5 pg (ref 26.0–34.0)
MCHC: 31.4 g/dL (ref 30.0–36.0)
MCV: 87.6 fL (ref 80.0–100.0)
Monocytes Absolute: 0.8 10*3/uL (ref 0.1–1.0)
Monocytes Relative: 9 %
Neutro Abs: 7.2 10*3/uL (ref 1.7–7.7)
Neutrophils Relative %: 80 %
Platelets: 230 10*3/uL (ref 150–400)
RBC: 4.04 MIL/uL (ref 3.87–5.11)
RDW: 14.5 % (ref 11.5–15.5)
WBC: 9 10*3/uL (ref 4.0–10.5)
nRBC: 0 % (ref 0.0–0.2)

## 2018-06-11 LAB — URINALYSIS, ROUTINE W REFLEX MICROSCOPIC
Bilirubin Urine: NEGATIVE
Glucose, UA: NEGATIVE mg/dL
Hgb urine dipstick: NEGATIVE
Ketones, ur: NEGATIVE mg/dL
Leukocytes,Ua: NEGATIVE
Nitrite: NEGATIVE
Protein, ur: NEGATIVE mg/dL
Specific Gravity, Urine: 1.016 (ref 1.005–1.030)
pH: 6 (ref 5.0–8.0)

## 2018-06-11 LAB — I-STAT BETA HCG BLOOD, ED (MC, WL, AP ONLY): I-stat hCG, quantitative: <5 m[IU]/mL (ref <5)

## 2018-06-11 LAB — LIPASE, BLOOD: Lipase: 19 U/L (ref 11–51)

## 2018-06-11 LAB — CK: Total CK: 120 U/L (ref 38–234)

## 2018-06-11 MED ORDER — SODIUM CHLORIDE 0.9 % IV BOLUS
1000.0000 mL | Freq: Once | INTRAVENOUS | Status: AC
  Administered 2018-06-11: 1000 mL via INTRAVENOUS

## 2018-06-11 MED ORDER — KETOROLAC TROMETHAMINE 30 MG/ML IJ SOLN
30.0000 mg | Freq: Once | INTRAMUSCULAR | Status: AC
  Administered 2018-06-11: 30 mg via INTRAVENOUS
  Filled 2018-06-11: qty 1

## 2018-06-11 MED ORDER — ONDANSETRON 4 MG PO TBDP: 4.0000 mg | ORAL_TABLET | Freq: Three times a day (TID) | ORAL | 0 refills | Status: DC | PRN

## 2018-06-11 MED ORDER — ONDANSETRON HCL 4 MG/2ML IJ SOLN
4.0000 mg | Freq: Once | INTRAMUSCULAR | Status: AC
  Administered 2018-06-11: 4 mg via INTRAVENOUS
  Filled 2018-06-11: qty 2

## 2018-06-11 MED ORDER — METRONIDAZOLE 500 MG PO TABS: 500.0000 mg | ORAL_TABLET | Freq: Two times a day (BID) | ORAL | 0 refills | Status: DC

## 2018-06-11 MED ORDER — METOCLOPRAMIDE HCL 5 MG/ML IJ SOLN
10.0000 mg | Freq: Once | INTRAMUSCULAR | Status: AC
  Administered 2018-06-11: 10 mg via INTRAVENOUS
  Filled 2018-06-11: qty 2

## 2018-06-11 MED ORDER — DIPHENHYDRAMINE HCL 50 MG/ML IJ SOLN
25.0000 mg | Freq: Once | INTRAMUSCULAR | Status: AC
  Administered 2018-06-11: 25 mg via INTRAVENOUS
  Filled 2018-06-11: qty 1

## 2018-06-11 MED ORDER — IOHEXOL 300 MG/ML  SOLN
100.0000 mL | Freq: Once | INTRAMUSCULAR | Status: AC | PRN
  Administered 2018-06-11: 13:00:00 100 mL via INTRAVENOUS

## 2018-06-11 MED ORDER — CIPROFLOXACIN IN D5W 400 MG/200ML IV SOLN
400.0000 mg | Freq: Once | INTRAVENOUS | Status: AC
  Administered 2018-06-11: 400 mg via INTRAVENOUS
  Filled 2018-06-11: qty 200

## 2018-06-11 MED ORDER — SODIUM CHLORIDE (PF) 0.9 % IJ SOLN
INTRAMUSCULAR | Status: AC
  Filled 2018-06-11: qty 50

## 2018-06-11 MED ORDER — CIPROFLOXACIN HCL 500 MG PO TABS: 500.0000 mg | ORAL_TABLET | Freq: Two times a day (BID) | ORAL | 0 refills | Status: DC

## 2018-06-11 MED ORDER — METRONIDAZOLE IN NACL 5-0.79 MG/ML-% IV SOLN
500.0000 mg | Freq: Once | INTRAVENOUS | Status: AC
  Administered 2018-06-11: 500 mg via INTRAVENOUS
  Filled 2018-06-11: qty 100

## 2018-06-11 NOTE — ED Triage Notes (Signed)
Pt c/o body aches and headache, nausea since yesterday. Denies vomiting, diarrhea, fevers, or nasal congestion, sore throat or recent travel. Pt works in Physicist, medical.

## 2018-06-11 NOTE — Discharge Instructions (Addendum)
As discussed, today's evaluation been generally reassuring. It is important that you take your antibiotics as prescribed, and monitor your condition carefully. Return here for concerning changes, otherwise please be sure to follow-up with our primary care clinic.

## 2018-06-11 NOTE — ED Provider Notes (Signed)
6:13 PM-checkout from Dr. Vanita Panda to evaluate after ultrasound imaging, done to evaluate for pelvic pathology, with guidance from the gynecologist who he talked to.  The patient has clinical and CT evidence for colitis.  She is nontoxic.  Ultrasound imaging of the pelvis shows degenerating right corpus luteum cyst, with a small amount of pelvic fluid.  No other pathology.   Patient Vitals for the past 24 hrs:  BP Temp Temp src Pulse Resp SpO2 Height  06/11/18 1800 96/60 - - 73 16 100 % -  06/11/18 1730 (!) 97/55 - - 71 - 100 % -  06/11/18 1700 94/62 - - 66 - 100 % -  06/11/18 1657 98/62 - - 70 16 100 % -  06/11/18 1630 98/62 - - 70 - 100 % -  06/11/18 1600 (!) 101/57 - - 68 - 100 % -  06/11/18 1550 107/66 - - 72 16 100 % -  06/11/18 1500 107/60 - - 71 - 100 % -  06/11/18 1438 (!) 96/58 - - 81 16 100 % -  06/11/18 1300 (!) 105/57 - - 73 16 100 % -  06/11/18 1230 104/62 - - 72 - 100 % -  06/11/18 1216 108/69 - - 66 16 100 % -  06/11/18 1030 98/66 - - 73 16 100 % -  06/11/18 1000 (!) 109/91 - - 71 - 100 % -  06/11/18 0930 103/66 - - 65 16 100 % -  06/11/18 0830 105/63 - - 68 - 100 % -  06/11/18 0721 102/66 99 F (37.2 C) Oral 85 13 100 % 5\' 2"  (1.575 m)    6:16 PM Reevaluation with update and discussion. After initial assessment and treatment, an updated evaluation reveals she is comfortable now and eating crackers with peanut butter.  Findings discussed with the patient and all questions were answered. Daleen Bo   Medical Decision Making: Abdominal pain with likely colitis of the colitis.  Pelvic ultrasound indicates corpus luteum cyst, with a small amount of physiologic fluid.  This does not appear to be the cause for her abdominal pain.  Doubt severe bacterial infection, metabolic instability or impending vascular collapse.  Patient does not have a GYN follow-up  No indication for further ED intervention, treatment or hospitalization.  CRITICAL CARE-no Performed by: Daleen Bo   Nursing Notes Reviewed/ Care Coordinated Applicable Imaging Reviewed Interpretation of Laboratory Data incorporated into ED treatment  The patient appears reasonably screened and/or stabilized for discharge and I doubt any other medical condition or other [facility] requiring further screening, evaluation, or treatment in the ED at this time prior to discharge.  Plan: Home Medications-OTC analgesia as needed, antibiotics, as prescribed; Home Treatments-low fiber diet; return here if the recommended treatment, does not improve the symptoms; Recommended follow up-PCP follow-up 1 week and as needed       Daleen Bo, MD 06/11/18 1845

## 2018-06-11 NOTE — ED Provider Notes (Addendum)
[place] DEPT Provider Note   CSN: [id number] Arrival date & time: 06/11/18  0703    History   Chief Complaint Chief Complaint  Patient presents with   Generalized Body Aches   Headache    HPI Tiffany Blair is a 33 y.o. female.     HPI Patient presents with concern of chest pain, headache, generalized body aches. She notes that for about 1 month she has had diffuse soreness, without focal weakness. She has not seen a physician during this time period. Over the past 24 hours she is also developed chest pain, described as tightness across the superior thorax, and headache, described as soreness. Chest pain has resolved, but the headache is persistent. No confusion, disorientation, vomiting.  Last bowel movement was yesterday, unremarkable. There is some anorexia, nausea, but this is minimal. No new medication taken for relief, and the patient states that she is generally well, taking no medication regularly. Last menstrual period 1 month ago. Past Medical History:  Diagnosis Date   Medical history non-contributory    Pregnancy     Patient Active Problem List   Diagnosis Date Noted   Anemia 06/30/2015   Transaminitis 06/29/2015   Calculus of gallbladder and bile duct with obstruction without cholecystitis     Past Surgical History:  Procedure Laterality Date   CHOLECYSTECTOMY N/A 07/01/2015   Procedure: LAPAROSCOPIC CHOLECYSTECTOMY WITH INTRAOPERATIVE CHOLANGIOGRAM;  Surgeon: Autumn Messing III, MD;  Location: [facility];  Service: General;  Laterality: N/A;   ERCP N/A 06/30/2015   Procedure: ENDOSCOPIC RETROGRADE CHOLANGIOPANCREATOGRAPHY (ERCP);  Surgeon: Clarene Essex, MD;  Location: [facility];  Service: Endoscopy;  Laterality: N/A;   WISDOM TOOTH EXTRACTION       OB History    Gravida  2   Para  1   Term  1   Preterm      AB  1   Living  1     SAB      TAB      Ectopic      Multiple      Live Births  1            Home Medications    Prior to Admission medications   Medication Sig Start Date End Date Taking? Authorizing Provider  GABAPENTIN PO Take 1 tablet by mouth daily.    Yes [provider]  ibuprofen (ADVIL,MOTRIN) 800 MG tablet Take 800 mg by mouth daily as needed for moderate pain.   Yes [provider]  PRESCRIPTION MEDICATION Take 1 tablet by mouth daily. Steroid   Yes [provider]  acetaminophen (TYLENOL) 500 MG tablet Take 1,000 mg by mouth every 8 (eight) hours as needed for mild pain or headache. pain    [provider]  cyclobenzaprine (FLEXERIL) 10 MG tablet Take 1 tablet (10 mg total) by mouth 2 (two) times daily as needed. Patient not taking: Reported on 06/11/2018 12/11/15   Anne Ng, PA-C  hydrocortisone cream 1 % Apply to affected area 2 times daily Patient not taking: Reported on 06/11/2018 07/15/16   Delia Heady, PA-C  naproxen (NAPROSYN) 500 MG tablet Take 1 tablet (500 mg total) by mouth 2 (two) times daily. Patient not taking: Reported on 06/11/2018 12/11/15   Anne Ng, PA-C  oxyCODONE-acetaminophen (PERCOCET/ROXICET) 5-325 MG tablet Take 1-2 tablets by mouth every 4 (four) hours as needed for moderate pain. Patient not taking: Reported on 06/11/2018 07/02/15   Debbe Odea, MD  Family History Family History  Problem Relation Age of Onset   Colon cancer Neg Hx    Cholelithiasis Neg Hx    Diabetes Neg Hx    Hypertension Neg Hx     Social History Social History   Tobacco Use   Smoking status: Former Smoker   Smokeless tobacco: Never Used  Substance Use Topics   Alcohol use: Yes    Comment: occ   Drug use: No    Types: Marijuana     Allergies   Patient has no known allergies.   Review of Systems Review of Systems  Constitutional:       Per HPI, otherwise negative  HENT:       Per HPI, otherwise negative  Respiratory:       Per HPI, otherwise negative  Cardiovascular:       Per HPI, otherwise  negative  Gastrointestinal: Positive for nausea. Negative for vomiting.  Endocrine:       Negative aside from HPI  Genitourinary:       Neg aside from HPI   Musculoskeletal:       Per HPI, otherwise negative  Skin: Negative.   Neurological: Positive for headaches. Negative for syncope.     Physical Exam Updated Vital Signs BP (!) 96/58    Pulse 81    Temp 99 F (37.2 C) (Oral)    Resp 16    Ht 5\' 2"  (1.575 m)    LMP 05/24/2018 Comment: neg preg test 06/11/2018   SpO2 100%    BMI 24.69 kg/m   Physical Exam Vitals signs and nursing note reviewed.  Constitutional:      General: She is not in acute distress.    Appearance: She is well-developed.  HENT:     Head: Normocephalic and atraumatic.  Eyes:     Conjunctiva/sclera: Conjunctivae normal.  Cardiovascular:     Rate and Rhythm: Normal rate and regular rhythm.  Pulmonary:     Effort: Pulmonary effort is normal. No respiratory distress.     Breath sounds: Normal breath sounds. No stridor.  Abdominal:     General: There is no distension.     Comments: Mild tenderness in the lower abdomen, no guarding, no rebound  Skin:    General: Skin is warm and dry.  Neurological:     Mental Status: She is alert and oriented to person, place, and time.     Cranial Nerves: No cranial nerve deficit.      ED Treatments / Results  Labs (all labs ordered are listed, but only abnormal results are displayed) Labs Reviewed  COMPREHENSIVE METABOLIC PANEL - Abnormal; Notable for the following components:      Result Value   Sodium 134 (*)    Calcium 8.7 (*)    All other components within normal limits  CBC WITH DIFFERENTIAL/PLATELET - Abnormal; Notable for the following components:   Hemoglobin 11.1 (*)    HCT 35.4 (*)    All other components within normal limits  LIPASE, BLOOD  URINALYSIS, ROUTINE W REFLEX MICROSCOPIC  CK  I-STAT BETA HCG BLOOD, ED ([facility], [facility], [facility] ONLY)    EKG None  Radiology Ct Abdomen Pelvis W Contrast  Result  Date: 06/11/2018 CLINICAL DATA:  Right lower quadrant abdominal pain. EXAM: CT ABDOMEN AND PELVIS WITH CONTRAST TECHNIQUE: Multidetector CT imaging of the abdomen and pelvis was performed using the standard protocol following bolus administration of intravenous contrast. CONTRAST:  169mL OMNIPAQUE IOHEXOL 300 MG/ML  SOLN COMPARISON:  None.  FINDINGS: Lower chest: No acute abnormality. Hepatobiliary: No focal liver abnormality is seen. Status post cholecystectomy. No biliary dilatation. Pancreas: Unremarkable. No pancreatic ductal dilatation or surrounding inflammatory changes. Spleen: Normal in size without focal abnormality. Adrenals/Urinary Tract: Adrenal glands are unremarkable. Kidneys are normal, without renal calculi, focal lesion, or hydronephrosis. Bladder is unremarkable. Stomach/Bowel: The stomach and appendix appear normal. Focal fold and wall thickening is seen involving the proximal transverse colon with mild amount of surrounding fluid suggesting focal colitis or diverticulitis. There is no evidence of bowel obstruction. Vascular/Lymphatic: No significant vascular findings are present. No enlarged abdominal or pelvic lymph nodes. Reproductive: Uterus and left adnexal region appear normal. Mild amount of free fluid is noted in the right adnexal region and posterior cul-de-sac of the pelvis. Also noted is 2 cm high density rounded abnormality in right adnexal region which may represent ruptured or involuting cyst. Other: No hernia is noted. Musculoskeletal: No acute or significant osseous findings. IMPRESSION: Focal wall and fold thickening is seen involving the proximal transverse colon with mild amount of surrounding inflammatory change suggesting focal colitis or diverticulitis. Clinical correlation is recommended. 2 cm rounded high density abnormality seen in right adnexal region with mild amount of free fluid seen in the right adnexal region and posterior cul-de-sac of the pelvis, concerning for  ruptured or involuting cyst. Pelvic ultrasound may be performed for further evaluation. Electronically Signed   By: Marijo Conception, M.D.   On: 06/11/2018 13:54   Dg Chest Port 1 View  Result Date: 06/11/2018 CLINICAL DATA:  Low grade feverinfluenza like illness EXAM: PORTABLE CHEST 1 VIEW COMPARISON:  06/25/2015 FINDINGS: Normal mediastinum and cardiac silhouette. Normal pulmonary vasculature. No evidence of effusion, infiltrate, or pneumothorax. No acute bony abnormality. IMPRESSION: Normal chest radiograph. Electronically Signed   By: Suzy Bouchard M.D.   On: 06/11/2018 08:54    Procedures Procedures (including critical care time)  Medications Ordered in ED Medications  sodium chloride (PF) 0.9 % injection (has no administration in time range)  ciprofloxacin (CIPRO) IVPB 400 mg (has no administration in time range)  metroNIDAZOLE (FLAGYL) IVPB 500 mg (has no administration in time range)  metoCLOPramide (REGLAN) injection 10 mg (has no administration in time range)  diphenhydrAMINE (BENADRYL) injection 25 mg (has no administration in time range)  sodium chloride 0.9 % bolus 1,000 mL (0 mLs Intravenous Stopped 06/11/18 1045)  ondansetron (ZOFRAN) injection 4 mg (4 mg Intravenous Given 06/11/18 0752)  sodium chloride 0.9 % bolus 1,000 mL (0 mLs Intravenous Stopped 06/11/18 1313)  ketorolac (TORADOL) 30 MG/ML injection 30 mg (30 mg Intravenous Given 06/11/18 1215)  iohexol (OMNIPAQUE) 300 MG/ML solution 100 mL (100 mLs Intravenous Contrast Given 06/11/18 1324)     Initial Impression / Assessment and Plan / ED Course  I have reviewed the triage vital signs and the nursing notes.  Pertinent labs & imaging results that were available during my care of the patient were reviewed by me and considered in my medical decision making (see chart for details).  Clinical Course as of Jun 12 1602  Wed Jun 11, 2018  1815 Urinalysis, Routine w reflex microscopic [EW]    Clinical Course User Index [EW]  Daleen Bo, MD   Update:, Patient continues to complain of diffuse discomfort, nausea.  Update: Initial studies reassuring, no leukocytosis, patient remains afebrile.  Update:, CT, after the patient complained of right-sided upper and lower abdominal pain, worse in the right lower quadrant.    CT results discussed with patient, and  subsequently with our gynecology colleagues given concern for Lesion Patient is young, colitis would be an unusual explanation for the patient's pain, but given her nausea, vomiting this is a consideration. Another consideration is adnexal abnormality, and after conversation, the patient will of ultrasound performed. In his current area of coronavirus awareness, outpatient ultrasound is limited. 3:30 PM Patient aware of all findings thus far, is receiving antibiotics for colitis, is awaiting ultrasound. Patient likely appropriate for discharge following provision of antibiotics, results. Dr. Eulis Foster is aware of the patient.  This previously well young female presents with new nausea, vomiting, abdominal pain, myalgia. Patient has no objective fever, has no cough, minimal risk profile for covid-19, no indication for testing. Patient is found to have likely colitis, possible reproductive tract lesion as well. Without hemodynamic instability, patient received antibiotics here, and after additional assessment was disposition accordingly.  Next day chart review, ultrasound reassuring.  Final Clinical Impressions(s) / ED Diagnoses  Influenza like illness Colitis   Carmin Muskrat, MD 06/11/18 1543    Carmin Muskrat, MD 06/12/18 [phone]

## 2018-06-11 NOTE — ED Notes (Signed)
Pt. Unable to urinate at this time. Will collect urine when pt. Voids. Nurse aware.  

## 2018-06-12 MED FILL — CIPROFLOXACIN HCL 500 MG TA: 500 | 7 days supply | Qty: 14 | Fill #0

## 2018-06-12 MED FILL — ONDANSETRON ODT 4 MG TABLET: 4 | 7 days supply | Qty: 20 | Fill #0

## 2018-06-12 MED FILL — METRONIDAZOLE 500 MG TABS: 500 | 7 days supply | Qty: 14 | Fill #0

## 2018-07-24 DIAGNOSIS — Z3200 Encounter for pregnancy test, result unknown: Secondary | ICD-10-CM | POA: Diagnosis not present

## 2018-07-24 DIAGNOSIS — Z3009 Encounter for other general counseling and advice on contraception: Secondary | ICD-10-CM | POA: Diagnosis not present

## 2018-07-27 ENCOUNTER — Encounter (HOSPITAL_COMMUNITY): Payer: Self-pay

## 2018-07-27 ENCOUNTER — Inpatient Hospital Stay (HOSPITAL_COMMUNITY)
Admission: AD | Admit: 2018-07-27 | Discharge: 2018-07-27 | Disposition: A | Discharge status: Home / Self Care | Payer: Medicaid Other | Attending: Obstetrics and Gynecology | Admitting: Obstetrics and Gynecology

## 2018-07-27 ENCOUNTER — Inpatient Hospital Stay (HOSPITAL_COMMUNITY): Payer: Medicaid Other

## 2018-07-27 ENCOUNTER — Other Ambulatory Visit: Payer: Self-pay

## 2018-07-27 DIAGNOSIS — O418X90 Other specified disorders of amniotic fluid and membranes, unspecified trimester, not applicable or unspecified: Secondary | ICD-10-CM | POA: Diagnosis present

## 2018-07-27 DIAGNOSIS — Z87891 Personal history of nicotine dependence: Secondary | ICD-10-CM | POA: Insufficient documentation

## 2018-07-27 DIAGNOSIS — R1030 Lower abdominal pain, unspecified: Secondary | ICD-10-CM | POA: Diagnosis not present

## 2018-07-27 DIAGNOSIS — O99611 Diseases of the digestive system complicating pregnancy, first trimester: Secondary | ICD-10-CM | POA: Diagnosis not present

## 2018-07-27 DIAGNOSIS — O26859 Spotting complicating pregnancy, unspecified trimester: Secondary | ICD-10-CM

## 2018-07-27 DIAGNOSIS — O208 Other hemorrhage in early pregnancy: Secondary | ICD-10-CM | POA: Insufficient documentation

## 2018-07-27 DIAGNOSIS — R109 Unspecified abdominal pain: Secondary | ICD-10-CM

## 2018-07-27 DIAGNOSIS — K117 Disturbances of salivary secretion: Secondary | ICD-10-CM | POA: Diagnosis not present

## 2018-07-27 DIAGNOSIS — Z3490 Encounter for supervision of normal pregnancy, unspecified, unspecified trimester: Secondary | ICD-10-CM

## 2018-07-27 DIAGNOSIS — Z3A01 Less than 8 weeks gestation of pregnancy: Secondary | ICD-10-CM | POA: Diagnosis not present

## 2018-07-27 DIAGNOSIS — O468X1 Other antepartum hemorrhage, first trimester: Secondary | ICD-10-CM

## 2018-07-27 DIAGNOSIS — O468X9 Other antepartum hemorrhage, unspecified trimester: Secondary | ICD-10-CM | POA: Diagnosis present

## 2018-07-27 DIAGNOSIS — O418X10 Other specified disorders of amniotic fluid and membranes, first trimester, not applicable or unspecified: Secondary | ICD-10-CM

## 2018-07-27 DIAGNOSIS — O26891 Other specified pregnancy related conditions, first trimester: Secondary | ICD-10-CM | POA: Diagnosis present

## 2018-07-27 LAB — CBC
HCT: 33.5 % — ABNORMAL LOW (ref 36.0–46.0)
Hemoglobin: 10.7 g/dL — ABNORMAL LOW (ref 12.0–15.0)
MCH: 27.2 pg (ref 26.0–34.0)
MCHC: 31.9 g/dL (ref 30.0–36.0)
MCV: 85.2 fL (ref 80.0–100.0)
Platelets: 248 10*3/uL (ref 150–400)
RBC: 3.93 MIL/uL (ref 3.87–5.11)
RDW: 14 % (ref 11.5–15.5)
WBC: 5.6 10*3/uL (ref 4.0–10.5)
nRBC: 0 % (ref 0.0–0.2)

## 2018-07-27 LAB — URINALYSIS, ROUTINE W REFLEX MICROSCOPIC
Bilirubin Urine: NEGATIVE
Glucose, UA: NEGATIVE mg/dL
Hgb urine dipstick: NEGATIVE
Ketones, ur: NEGATIVE mg/dL
Leukocytes,Ua: NEGATIVE
Nitrite: NEGATIVE
Protein, ur: NEGATIVE mg/dL
Specific Gravity, Urine: 1.025 (ref 1.005–1.030)
pH: 5 (ref 5.0–8.0)

## 2018-07-27 LAB — RAPID URINE DRUG SCREEN, HOSP PERFORMED
Amphetamines: NOT DETECTED
Barbiturates: NOT DETECTED
Benzodiazepines: NOT DETECTED
Cocaine: NOT DETECTED
Opiates: NOT DETECTED
Tetrahydrocannabinol: NOT DETECTED

## 2018-07-27 LAB — WET PREP, GENITAL
Clue Cells Wet Prep HPF POC: NONE SEEN
Sperm: NONE SEEN
Trich, Wet Prep: NONE SEEN
Yeast Wet Prep HPF POC: NONE SEEN

## 2018-07-27 LAB — HCG, QUANTITATIVE, PREGNANCY: hCG, Beta Chain, Quant, S: 31499 m[IU]/mL — ABNORMAL HIGH (ref <5)

## 2018-07-27 LAB — POCT PREGNANCY, URINE: Preg Test, Ur: POSITIVE — AB

## 2018-07-27 MED ORDER — GLYCOPYRROLATE 1 MG PO TABS: 1.0000 mg | ORAL_TABLET | Freq: Three times a day (TID) | ORAL | 0 refills | Status: DC

## 2018-07-27 NOTE — Discharge Instructions (Signed)
START PRENATAL CARE BETWEEN 10-12 [place] for [place] for [place] @ [facility]   Phone: [phone]  [place] for [place] @ [place]   Phone: [place] @[place]  [facility]       Phone: [place] for [place] @ [place]     Phone: [place] for [place] @ [facility] [employer]   Phone: [place] for [place] @ [place]  Phone: [place])  Phone: [phone]

## 2018-07-27 NOTE — MAU Provider Note (Signed)
Chief Complaint: Abdominal Pain; Back Pain; and Vaginal Bleeding   First Provider Initiated Contact with Patient 07/27/18 1555        SUBJECTIVE HPI: Tiffany Blair is a 33 y.o. G3P1011 at [redacted]w[redacted]d by LMP who presents to MAU reporting sharp abdominal pain (rated 6/10) since last week, (+) HPT on 5/13 & (+) UPT @ [facility] on 5/15, back pain (rated 8/10), vaginal irritation with no foul odor, but increased amount and pink in color. She also reports having a "metallic taste in her mouth and she spits a lot." She denies urinary symptoms, h/a, dizziness, n/v, or fever/chills.     Past Medical History:  Diagnosis Date  . Medical history non-contributory   . Pregnancy    Past Surgical History:  Procedure Laterality Date  . CHOLECYSTECTOMY N/A 07/01/2015   Procedure: LAPAROSCOPIC CHOLECYSTECTOMY WITH INTRAOPERATIVE CHOLANGIOGRAM;  Surgeon: Autumn Messing III, MD;  Location: [facility];  Service: General;  Laterality: N/A;  . ERCP N/A 06/30/2015   Procedure: ENDOSCOPIC RETROGRADE CHOLANGIOPANCREATOGRAPHY (ERCP);  Surgeon: Clarene Essex, MD;  Location: [facility];  Service: Endoscopy;  Laterality: N/A;  . WISDOM TOOTH EXTRACTION     Social History   Socioeconomic History  . Marital status: Single    Spouse name: Not on file  . Number of children: Not on file  . Years of education: Not on file  . Highest education level: Not on file  Occupational History  . Not on file  Social Needs  . Financial resource strain: Not on file  . Food insecurity:    Worry: Not on file    Inability: Not on file  . Transportation needs:    Medical: Not on file    Non-medical: Not on file  Tobacco Use  . Smoking status: Former Research scientist (life sciences)  . Smokeless tobacco: Never Used  Substance and Sexual Activity  . Alcohol use: Not Currently    Comment: occ  . Drug use: No    Types: Marijuana  . Sexual activity: Yes    Birth control/protection: None  Lifestyle  . Physical activity:    Days per week: Not on file    Minutes per  session: Not on file  . Stress: Not on file  Relationships  . Social connections:    Talks on phone: Not on file    Gets together: Not on file    Attends religious service: Not on file    Active member of club or organization: Not on file    Attends meetings of clubs or organizations: Not on file    Relationship status: Not on file  . Intimate partner violence:    Fear of current or ex partner: Not on file    Emotionally abused: Not on file    Physically abused: Not on file    Forced sexual activity: Not on file  Other Topics Concern  . Not on file  Social History Narrative  . Not on file   No current facility-administered medications on file prior to encounter.    Current Outpatient Medications on File Prior to Encounter  Medication Sig Dispense Refill  . acetaminophen (TYLENOL) 500 MG tablet Take 1,000 mg by mouth every 8 (eight) hours as needed for mild pain or headache. pain    . ciprofloxacin (CIPRO) 500 MG tablet Take 1 tablet (500 mg total) by mouth 2 (two) times daily. 14 tablet 0  . cyclobenzaprine (FLEXERIL) 10 MG tablet Take 1 tablet (10 mg total) by mouth 2 (two) times daily as needed. (  Patient not taking: Reported on 06/11/2018) 20 tablet 0  . GABAPENTIN PO Take 1 tablet by mouth daily.     . hydrocortisone cream 1 % Apply to affected area 2 times daily (Patient not taking: Reported on 06/11/2018) 15 g 0  . ibuprofen (ADVIL,MOTRIN) 800 MG tablet Take 800 mg by mouth daily as needed for moderate pain.    . metroNIDAZOLE (FLAGYL) 500 MG tablet Take 1 tablet (500 mg total) by mouth 2 (two) times daily. 14 tablet 0  . naproxen (NAPROSYN) 500 MG tablet Take 1 tablet (500 mg total) by mouth 2 (two) times daily. (Patient not taking: Reported on 06/11/2018) 30 tablet 0  . ondansetron (ZOFRAN ODT) 4 MG disintegrating tablet Take 1 tablet (4 mg total) by mouth every 8 (eight) hours as needed for nausea or vomiting. 20 tablet 0  . oxyCODONE-acetaminophen (PERCOCET/ROXICET) 5-325 MG  tablet Take 1-2 tablets by mouth every 4 (four) hours as needed for moderate pain. (Patient not taking: Reported on 06/11/2018) 15 tablet 0  . PRESCRIPTION MEDICATION Take 1 tablet by mouth daily. Steroid     No Known Allergies  I have reviewed patient's Past Medical Hx, Surgical Hx, Family Hx, Social Hx, medications and allergies.   ROS:  Review of Systems  Constitutional: Negative.   HENT: Negative.   Eyes: Negative.   Respiratory: Negative.   Cardiovascular: Negative.   Gastrointestinal: Positive for abdominal pain (lower abdomen).  Endocrine: Negative.   Genitourinary: Positive for vaginal discharge (irritation & pink color).  Musculoskeletal: Positive for back pain.  Skin: Negative.   Allergic/Immunologic: Negative.   Neurological: Negative.   Hematological: Negative.   Psychiatric/Behavioral: Negative.     Physical Exam  Physical Exam Patient Vitals for the past 24 hrs:  BP Temp Temp src Pulse Resp SpO2 Height Weight  07/27/18 1554 (!) 97/55 - - 62 - - - -  07/27/18 1530 (!) 103/57 98.9 F (37.2 C) Oral 66 18 100 % 5\' 2"  (1.575 m) 77.5 kg   Constitutional: Well-developed, well-nourished female in no acute distress.  Cardiovascular: normal rate Respiratory: normal effort GI: Abd soft, non-tender. Pos BS x 4 MS: Extremities nontender, no edema, normal ROM Neurologic: Alert and oriented x 4.  GU: Neg CVAT.  PELVIC EXAM: Cervix pink, visually closed, without lesion, scant pinkish-white, creamy discharge, vaginal walls and external genitalia normal Bimanual exam: Cervix 0/long/high, firm, anterior, neg CMT, uterus nontender, nonenlarged, RT adnexa without tenderness, (+) moderate tenderness in LT adnexa, no enlargement, or adnexal mass   LAB RESULTS Results for orders placed or performed during the hospital encounter of 07/27/18 (from the past 24 hour(s))  Urinalysis, Routine w reflex microscopic     Status: None   Collection Time: 07/27/18  3:23 PM  Result Value Ref  Range   Color, Urine YELLOW YELLOW   APPearance CLEAR CLEAR   Specific Gravity, Urine 1.025 1.005 - 1.030   pH 5.0 5.0 - 8.0   Glucose, UA NEGATIVE NEGATIVE mg/dL   Hgb urine dipstick NEGATIVE NEGATIVE   Bilirubin Urine NEGATIVE NEGATIVE   Ketones, ur NEGATIVE NEGATIVE mg/dL   Protein, ur NEGATIVE NEGATIVE mg/dL   Nitrite NEGATIVE NEGATIVE   Leukocytes,Ua NEGATIVE NEGATIVE  Rapid urine drug screen (hospital performed)     Status: None   Collection Time: 07/27/18  3:23 PM  Result Value Ref Range   Opiates NONE DETECTED NONE DETECTED   Cocaine NONE DETECTED NONE DETECTED   Benzodiazepines NONE DETECTED NONE DETECTED   Amphetamines NONE DETECTED NONE  DETECTED   Tetrahydrocannabinol NONE DETECTED NONE DETECTED   Barbiturates NONE DETECTED NONE DETECTED  Pregnancy, urine POC     Status: Abnormal   Collection Time: 07/27/18  3:24 PM  Result Value Ref Range   Preg Test, Ur POSITIVE (A) NEGATIVE  CBC     Status: Abnormal   Collection Time: 07/27/18  3:58 PM  Result Value Ref Range   WBC 5.6 4.0 - 10.5 K/uL   RBC 3.93 3.87 - 5.11 MIL/uL   Hemoglobin 10.7 (L) 12.0 - 15.0 g/dL   HCT 33.5 (L) 36.0 - 46.0 %   MCV 85.2 80.0 - 100.0 fL   MCH 27.2 26.0 - 34.0 pg   MCHC 31.9 30.0 - 36.0 g/dL   RDW 14.0 11.5 - 15.5 %   Platelets 248 150 - 400 K/uL   nRBC 0.0 0.0 - 0.2 %  hCG, quantitative, pregnancy     Status: Abnormal   Collection Time: 07/27/18  3:58 PM  Result Value Ref Range   hCG, Beta Chain, Quant, S 31,499 (H) <5 mIU/mL  Wet prep, genital     Status: Abnormal   Collection Time: 07/27/18  4:11 PM  Result Value Ref Range   Yeast Wet Prep HPF POC NONE SEEN NONE SEEN   Trich, Wet Prep NONE SEEN NONE SEEN   Clue Cells Wet Prep HPF POC NONE SEEN NONE SEEN   WBC, Wet Prep HPF POC MODERATE (A) NONE SEEN   Sperm NONE SEEN     IMAGING US Ob Less Than 14 Weeks With Ob Transvaginal  Result Date: 07/27/2018 CLINICAL DATA:  Pregnant patient with spotting. EXAM: OBSTETRIC <14 WK [place] AND  TRANSVAGINAL OB US TECHNIQUE: Both transabdominal and transvaginal ultrasound examinations were performed for complete evaluation of the gestation as well as the maternal uterus, adnexal regions, and pelvic cul-de-sac. Transvaginal technique was performed to assess early pregnancy. COMPARISON:  None. FINDINGS: Intrauterine gestational sac: Single Yolk sac:  Visualized. Embryo:  Visualized. Cardiac Activity: Visualized. Heart Rate: 115 bpm MSD:   mm    w     d CRL:  3.14 mm   5 w   6 d                  [place] [facility]: March 23, 2019 Subchorionic hemorrhage:  There is a small subchorionic hemorrhage. Maternal uterus/adnexae: A corpus luteum cyst is seen in the right ovary. The left ovary is normal. Trace fluid in the pelvis is likely physiologic. IMPRESSION: 1. Single live IUP with a small subchorionic hemorrhage. Electronically Signed   By: Dorise Bullion III M.D   On: 07/27/2018 17:03    MAU Management/MDM: Myer Peer UPT UDS Ordered usual first trimester r/o ectopic labs.   Pelvic exam and cultures done Will check baseline Ultrasound to rule out ectopic.  Cultures were done to rule out pelvic infection Blood drawn for Quant HCG, CBC, ABO/Rh  This bleeding/pain can represent a normal pregnancy with bleeding, spontaneous abortion or even an ectopic which can be life-threatening.  The process as listed above helps to determine which of these is present.   ASSESSMENT 1. Intrauterine pregnancy   2. Spotting affecting pregnancy   3. Abdominal pain during pregnancy in first trimester   4. Subchorionic hematoma in first trimester, single or unspecified fetus   5. Ptyalism     PLAN Discharge home Rx for Robinul 1 mg TID Reassurance given that lower abd pain and pink spotting is more than likely from [facility] Information provided on  [place], bleeding in pregnancy, abdominal pain in pregnancy   Pt stable at time of discharge. Encouraged to Return to MAU:  If you have heavier bleeding that soaks through more that  2 pads per hour for an hour or more  If you bleed so much that you feel like you might pass out or you do pass out  If you have significant abdominal pain that is not improved with Tylenol   If you develop a fever > 100.5  Laury Deep MSN, CNM Certified Nurse-Midwife 07/27/2018 , 3:55 PM

## 2018-07-27 NOTE — Progress Notes (Signed)
Pt states when she wipes it feels very irritated, no odor but some increased discharge that is pink.

## 2018-07-27 NOTE — MAU Note (Signed)
Tiffany Blair is a 33 y.o. at [redacted]w[redacted]d here in MAU reporting: sharp abdominal pain since last week. Also having back pain. Had not tried any medication. Reports spotting. [facility] a + [facility].  LMP: 06/18/18  Onset of complaint: ongoing  Pain score: abdomen 6/10, back 8/10  Vitals:   07/27/18 1530  BP: (!) 103/57  Pulse: 66  Resp: 18  Temp: 98.9 F (37.2 C)  SpO2: 100%      Lab orders placed from triage: UA, UPT

## 2018-07-28 LAB — GC/CHLAMYDIA PROBE AMP (~~LOC~~) NOT AT ARMC
Chlamydia: NEGATIVE
Neisseria Gonorrhea: NEGATIVE

## 2018-07-28 LAB — HIV ANTIBODY (ROUTINE TESTING W REFLEX): HIV Screen 4th Generation wRfx: NONREACTIVE

## 2019-05-14 DIAGNOSIS — Z1329 Encounter for screening for other suspected endocrine disorder: Secondary | ICD-10-CM | POA: Diagnosis not present

## 2019-05-14 DIAGNOSIS — G44209 Tension-type headache, unspecified, not intractable: Secondary | ICD-10-CM | POA: Diagnosis not present

## 2019-05-14 DIAGNOSIS — Z01118 Encounter for examination of ears and hearing with other abnormal findings: Secondary | ICD-10-CM | POA: Diagnosis not present

## 2019-05-14 DIAGNOSIS — Z5181 Encounter for therapeutic drug level monitoring: Secondary | ICD-10-CM | POA: Diagnosis not present

## 2019-05-14 DIAGNOSIS — Z0001 Encounter for general adult medical examination with abnormal findings: Secondary | ICD-10-CM | POA: Diagnosis not present

## 2019-05-14 DIAGNOSIS — Z1389 Encounter for screening for other disorder: Secondary | ICD-10-CM | POA: Diagnosis not present

## 2019-05-14 DIAGNOSIS — Z136 Encounter for screening for cardiovascular disorders: Secondary | ICD-10-CM | POA: Diagnosis not present

## 2019-05-14 DIAGNOSIS — Z01021 Encounter for examination of eyes and vision following failed vision screening with abnormal findings: Secondary | ICD-10-CM | POA: Diagnosis not present

## 2019-05-14 DIAGNOSIS — Z131 Encounter for screening for diabetes mellitus: Secondary | ICD-10-CM | POA: Diagnosis not present

## 2019-05-14 DIAGNOSIS — E6609 Other obesity due to excess calories: Secondary | ICD-10-CM | POA: Diagnosis not present

## 2019-05-28 DIAGNOSIS — E6609 Other obesity due to excess calories: Secondary | ICD-10-CM | POA: Diagnosis not present

## 2019-05-28 DIAGNOSIS — K5904 Chronic idiopathic constipation: Secondary | ICD-10-CM | POA: Diagnosis not present

## 2019-06-25 ENCOUNTER — Other Ambulatory Visit: Payer: Self-pay | Admitting: Physician Assistant

## 2019-06-25 DIAGNOSIS — K5904 Chronic idiopathic constipation: Secondary | ICD-10-CM | POA: Diagnosis not present

## 2019-06-25 DIAGNOSIS — E6609 Other obesity due to excess calories: Secondary | ICD-10-CM | POA: Diagnosis not present

## 2019-06-25 DIAGNOSIS — N6310 Unspecified lump in the right breast, unspecified quadrant: Secondary | ICD-10-CM

## 2019-06-25 DIAGNOSIS — N644 Mastodynia: Secondary | ICD-10-CM | POA: Diagnosis not present

## 2019-06-25 DIAGNOSIS — N63 Unspecified lump in unspecified breast: Secondary | ICD-10-CM | POA: Diagnosis not present

## 2019-06-25 DIAGNOSIS — N6320 Unspecified lump in the left breast, unspecified quadrant: Secondary | ICD-10-CM

## 2019-06-30 ENCOUNTER — Other Ambulatory Visit: Payer: Self-pay | Admitting: Physician Assistant

## 2019-06-30 ENCOUNTER — Ambulatory Visit
Admission: RE | Admit: 2019-06-30 | Discharge: 2019-06-30 | Disposition: A | Discharge status: Home / Self Care | Payer: Medicaid Other | Source: Ambulatory Visit | Attending: Physician Assistant | Admitting: Physician Assistant

## 2019-06-30 ENCOUNTER — Other Ambulatory Visit: Payer: Self-pay

## 2019-06-30 ENCOUNTER — Ambulatory Visit
Admission: RE | Admit: 2019-06-30 | Discharge: 2019-06-30 | Disposition: A | Discharge status: Home / Self Care | Payer: 59 | Source: Ambulatory Visit | Attending: Physician Assistant | Admitting: Physician Assistant

## 2019-06-30 DIAGNOSIS — N6021 Fibroadenosis of right breast: Secondary | ICD-10-CM | POA: Diagnosis not present

## 2019-06-30 DIAGNOSIS — N6001 Solitary cyst of right breast: Secondary | ICD-10-CM

## 2019-06-30 DIAGNOSIS — N6022 Fibroadenosis of left breast: Secondary | ICD-10-CM | POA: Diagnosis not present

## 2019-06-30 DIAGNOSIS — N6320 Unspecified lump in the left breast, unspecified quadrant: Secondary | ICD-10-CM

## 2019-06-30 DIAGNOSIS — R921 Mammographic calcification found on diagnostic imaging of breast: Secondary | ICD-10-CM | POA: Diagnosis not present

## 2019-06-30 DIAGNOSIS — N6310 Unspecified lump in the right breast, unspecified quadrant: Secondary | ICD-10-CM

## 2019-07-13 ENCOUNTER — Ambulatory Visit
Admission: RE | Admit: 2019-07-13 | Discharge: 2019-07-13 | Disposition: A | Discharge status: Home / Self Care | Payer: 59 | Source: Ambulatory Visit | Attending: Physician Assistant | Admitting: Physician Assistant

## 2019-07-13 ENCOUNTER — Other Ambulatory Visit: Payer: Self-pay

## 2019-07-13 DIAGNOSIS — N6320 Unspecified lump in the left breast, unspecified quadrant: Secondary | ICD-10-CM

## 2019-07-13 DIAGNOSIS — N6324 Unspecified lump in the left breast, lower inner quadrant: Secondary | ICD-10-CM | POA: Diagnosis not present

## 2019-07-13 DIAGNOSIS — N6001 Solitary cyst of right breast: Secondary | ICD-10-CM

## 2019-07-13 DIAGNOSIS — D242 Benign neoplasm of left breast: Secondary | ICD-10-CM | POA: Diagnosis not present

## 2019-07-17 ENCOUNTER — Other Ambulatory Visit: Payer: Self-pay | Admitting: Physician Assistant

## 2019-07-20 DIAGNOSIS — Z113 Encounter for screening for infections with a predominantly sexual mode of transmission: Secondary | ICD-10-CM | POA: Diagnosis not present

## 2019-07-20 DIAGNOSIS — B373 Candidiasis of vulva and vagina: Secondary | ICD-10-CM | POA: Diagnosis not present

## 2019-08-20 DIAGNOSIS — J40 Bronchitis, not specified as acute or chronic: Secondary | ICD-10-CM | POA: Diagnosis not present

## 2019-08-20 DIAGNOSIS — E6609 Other obesity due to excess calories: Secondary | ICD-10-CM | POA: Diagnosis not present

## 2019-08-20 DIAGNOSIS — K5904 Chronic idiopathic constipation: Secondary | ICD-10-CM | POA: Diagnosis not present

## 2019-09-17 DIAGNOSIS — B373 Candidiasis of vulva and vagina: Secondary | ICD-10-CM | POA: Diagnosis not present

## 2019-09-17 DIAGNOSIS — E6609 Other obesity due to excess calories: Secondary | ICD-10-CM | POA: Diagnosis not present

## 2019-09-17 DIAGNOSIS — Z113 Encounter for screening for infections with a predominantly sexual mode of transmission: Secondary | ICD-10-CM | POA: Diagnosis not present

## 2019-09-17 DIAGNOSIS — K5904 Chronic idiopathic constipation: Secondary | ICD-10-CM | POA: Diagnosis not present

## 2019-09-17 DIAGNOSIS — Z124 Encounter for screening for malignant neoplasm of cervix: Secondary | ICD-10-CM | POA: Diagnosis not present

## 2019-11-12 DIAGNOSIS — M25542 Pain in joints of left hand: Secondary | ICD-10-CM | POA: Diagnosis not present

## 2019-11-12 DIAGNOSIS — K5904 Chronic idiopathic constipation: Secondary | ICD-10-CM | POA: Diagnosis not present

## 2019-11-12 DIAGNOSIS — E6609 Other obesity due to excess calories: Secondary | ICD-10-CM | POA: Diagnosis not present

## 2019-11-12 DIAGNOSIS — M25541 Pain in joints of right hand: Secondary | ICD-10-CM | POA: Diagnosis not present

## 2019-11-12 DIAGNOSIS — R5383 Other fatigue: Secondary | ICD-10-CM | POA: Diagnosis not present

## 2020-01-21 DIAGNOSIS — E559 Vitamin D deficiency, unspecified: Secondary | ICD-10-CM | POA: Diagnosis not present

## 2020-01-21 DIAGNOSIS — E6609 Other obesity due to excess calories: Secondary | ICD-10-CM | POA: Diagnosis not present

## 2020-01-21 DIAGNOSIS — K219 Gastro-esophageal reflux disease without esophagitis: Secondary | ICD-10-CM | POA: Diagnosis not present

## 2020-01-21 DIAGNOSIS — K5904 Chronic idiopathic constipation: Secondary | ICD-10-CM | POA: Diagnosis not present

## 2020-01-21 DIAGNOSIS — Z9189 Other specified personal risk factors, not elsewhere classified: Secondary | ICD-10-CM | POA: Diagnosis not present

## 2020-01-21 DIAGNOSIS — M25542 Pain in joints of left hand: Secondary | ICD-10-CM | POA: Diagnosis not present

## 2020-01-21 DIAGNOSIS — R0602 Shortness of breath: Secondary | ICD-10-CM | POA: Diagnosis not present

## 2020-01-21 DIAGNOSIS — M545 Low back pain, unspecified: Secondary | ICD-10-CM | POA: Diagnosis not present

## 2020-01-21 DIAGNOSIS — M25541 Pain in joints of right hand: Secondary | ICD-10-CM | POA: Diagnosis not present

## 2020-02-11 DIAGNOSIS — M25542 Pain in joints of left hand: Secondary | ICD-10-CM | POA: Diagnosis not present

## 2020-02-11 DIAGNOSIS — K5904 Chronic idiopathic constipation: Secondary | ICD-10-CM | POA: Diagnosis not present

## 2020-02-11 DIAGNOSIS — E6609 Other obesity due to excess calories: Secondary | ICD-10-CM | POA: Diagnosis not present

## 2020-02-11 DIAGNOSIS — J028 Acute pharyngitis due to other specified organisms: Secondary | ICD-10-CM | POA: Diagnosis not present

## 2020-02-11 DIAGNOSIS — K219 Gastro-esophageal reflux disease without esophagitis: Secondary | ICD-10-CM | POA: Diagnosis not present

## 2020-02-11 DIAGNOSIS — M25541 Pain in joints of right hand: Secondary | ICD-10-CM | POA: Diagnosis not present

## 2020-02-11 DIAGNOSIS — M545 Low back pain, unspecified: Secondary | ICD-10-CM | POA: Diagnosis not present

## 2020-02-11 DIAGNOSIS — Z9189 Other specified personal risk factors, not elsewhere classified: Secondary | ICD-10-CM | POA: Diagnosis not present

## 2020-02-17 ENCOUNTER — Ambulatory Visit (INDEPENDENT_AMBULATORY_CARE_PROVIDER_SITE_OTHER): Payer: 59 | Admitting: Pulmonary Disease

## 2020-02-17 ENCOUNTER — Encounter: Payer: Self-pay | Admitting: Pulmonary Disease

## 2020-02-17 ENCOUNTER — Other Ambulatory Visit: Payer: Self-pay

## 2020-02-17 VITALS — BP 132/76 | HR 84 | Temp 97.4°F | Ht 62.0 in | Wt 170.0 lb

## 2020-02-17 DIAGNOSIS — G4733 Obstructive sleep apnea (adult) (pediatric): Secondary | ICD-10-CM

## 2020-02-17 NOTE — Progress Notes (Signed)
.  ho

## 2020-02-17 NOTE — Progress Notes (Signed)
Tiffany Blair    [id number]    1986-02-05  Primary Care Physician:Vanstory, Maud Deed, PA  Referring Physician: Department, [facility] [address] [place],  [place] [address]  Chief complaint:   Patient with a history of snoring, witnessed apneas  HPI:  Witnessed apneas, choking respirations at night Nonrestorative sleep with multiple multiple awakenings  Usually goes to bed about 1030 Sometimes takes hours to fall asleep About 2 awakenings Final wake up time about 6 AM  No family history of obstructive sleep apnea  Admits to dryness of her mouth sometimes Does have right-sided morning headaches No excessive sweating Occasionally suffers from fogginess, states her memory is fair  Reformed smoker quit about 3 months ago  Outpatient Encounter Medications as of 02/17/2020  Medication Sig  . meloxicam (MOBIC) 7.5 MG tablet Take 7.5 mg by mouth 2 (two) times daily as needed for pain.  . [DISCONTINUED] glycopyrrolate (ROBINUL) 1 MG tablet Take 1 tablet (1 mg total) by mouth 3 (three) times daily.  . [DISCONTINUED] ondansetron (ZOFRAN ODT) 4 MG disintegrating tablet Take 1 tablet (4 mg total) by mouth every 8 (eight) hours as needed for nausea or vomiting.  Marland Kitchen acetaminophen (TYLENOL) 500 MG tablet Take 1,000 mg by mouth every 8 (eight) hours as needed for mild pain or headache. pain (Patient not taking: Reported on 02/17/2020)   No facility-administered encounter medications on file as of 02/17/2020.    Allergies as of 02/17/2020  . (No Known Allergies)    Past Medical History:  Diagnosis Date  . Medical history non-contributory   . Pregnancy     Past Surgical History:  Procedure Laterality Date  . CHOLECYSTECTOMY N/A 07/01/2015   Procedure: LAPAROSCOPIC CHOLECYSTECTOMY WITH INTRAOPERATIVE CHOLANGIOGRAM;  Surgeon: Autumn Messing III, MD;  Location: [facility];  Service: General;  Laterality: N/A;  . ERCP N/A 06/30/2015   Procedure: ENDOSCOPIC  RETROGRADE CHOLANGIOPANCREATOGRAPHY (ERCP);  Surgeon: Clarene Essex, MD;  Location: [facility];  Service: Endoscopy;  Laterality: N/A;  . WISDOM TOOTH EXTRACTION      Family History  Problem Relation Age of Onset  . Colon cancer Neg Hx   . Cholelithiasis Neg Hx   . Diabetes Neg Hx   . Hypertension Neg Hx     Social History   Socioeconomic History  . Marital status: Single    Spouse name: Not on file  . Number of children: Not on file  . Years of education: Not on file  . Highest education level: Not on file  Occupational History  . Not on file  Tobacco Use  . Smoking status: Former Research scientist (life sciences)  . Smokeless tobacco: Never Used  Vaping Use  . Vaping Use: Never used  Substance and Sexual Activity  . Alcohol use: Not Currently    Comment: occ  . Drug use: No    Types: Marijuana  . Sexual activity: Yes    Birth control/protection: None  Other Topics Concern  . Not on file  Social History Narrative  . Not on file   Social Determinants of Health   Financial Resource Strain:   . Difficulty of Paying Living Expenses: Not on file  Food Insecurity:   . Worried About Charity fundraiser in the Last Year: Not on file  . Ran Out of Food in the Last Year: Not on file  Transportation Needs:   . Lack of Transportation (Medical): Not on file  . Lack of Transportation (Non-Medical): Not on  file  Physical Activity:   . Days of Exercise per Week: Not on file  . Minutes of Exercise per Session: Not on file  Stress:   . Feeling of Stress : Not on file  Social Connections:   . Frequency of Communication with Friends and Family: Not on file  . Frequency of Social Gatherings with Friends and Family: Not on file  . Attends Religious Services: Not on file  . Active Member of Clubs or Organizations: Not on file  . Attends Archivist Meetings: Not on file  . Marital Status: Not on file  Intimate Partner Violence:   . Fear of Current or Ex-Partner: Not on file  . Emotionally  Abused: Not on file  . Physically Abused: Not on file  . Sexually Abused: Not on file    Review of Systems  Respiratory: Positive for shortness of breath.   Psychiatric/Behavioral: Positive for sleep disturbance.    Vitals:   02/17/20 1452  BP: 132/76  Pulse: 84  Temp: (!) 97.4 F (36.3 C)  SpO2: 100%     Physical Exam Constitutional:      Appearance: She is obese.  HENT:     Nose: No congestion or rhinorrhea.     Mouth/Throat:     Mouth: Mucous membranes are moist.     Pharynx: No oropharyngeal exudate or posterior oropharyngeal erythema.     Comments: Mallampati 3, crowded oropharynx Eyes:     General:        Right eye: No discharge.        Left eye: No discharge.  Cardiovascular:     Rate and Rhythm: Normal rate and regular rhythm.     Heart sounds: No murmur heard.  No friction rub.  Pulmonary:     Effort: No respiratory distress.     Breath sounds: No stridor. No wheezing or rhonchi.  Musculoskeletal:     Cervical back: No rigidity or tenderness.  Neurological:     Mental Status: She is alert.  Psychiatric:        Mood and Affect: Mood normal.    Results of the Epworth flowsheet 02/17/2020  Sitting and reading 1  Watching TV 3  Sitting, inactive in a public place (e.g. a theatre or a meeting) 2  As a passenger in a car for an hour without a break 3  Lying down to rest in the afternoon when circumstances permit 2  Sitting and talking to someone 0  Sitting quietly after a lunch without alcohol 3  In a car, while stopped for a few minutes in traffic 0  Total score 14     Assessment:  Excessive daytime sleepiness  Daytime fatigue  High probability of significant obstructive sleep apnea  Pathophysiology of sleep disordered breathing discussed with the patient Treatment options for sleep disordered breathing discussed with the patient  Plan/Recommendations: Schedule patient for home sleep study  Risks of not treating sleep disordered breathing  discussed with the patient  We will follow up with the patient following initiation of treatment   Sherrilyn Rist MD [facility] 02/17/2020, 4:28 PM  CC: Department, [place]

## 2020-02-17 NOTE — Patient Instructions (Signed)
Moderate to high probability of significant obstructive sleep apnea  We will set you up with a home sleep study Update your results as soon as reviewed  Treatment options as we discussed  Call with significant concerns  Follow-up tentatively in about 3 months or a month after initiation of treatment   Sleep Apnea Sleep apnea affects breathing during sleep. It causes breathing to stop for a short time or to become shallow. It can also increase the risk of:  Heart attack.  Stroke.  Being very overweight (obese).  Diabetes.  Heart failure.  Irregular heartbeat. The goal of treatment is to help you breathe normally again. What are the causes? There are three kinds of sleep apnea:  Obstructive sleep apnea. This is caused by a blocked or collapsed airway.  Central sleep apnea. This happens when the brain does not send the right signals to the muscles that control breathing.  Mixed sleep apnea. This is a combination of obstructive and central sleep apnea. The most common cause of this condition is a collapsed or blocked airway. This can happen if:  Your throat muscles are too relaxed.  Your tongue and tonsils are too large.  You are overweight.  Your airway is too small. What increases the risk?  Being overweight.  Smoking.  Having a small airway.  Being older.  Being female.  Drinking alcohol.  Taking medicines to calm yourself (sedatives or tranquilizers).  Having family members with the condition. What are the signs or symptoms?  Trouble staying asleep.  Being sleepy or tired during the day.  Getting angry a lot.  Loud snoring.  Headaches in the morning.  Not being able to focus your mind (concentrate).  Forgetting things.  Less interest in sex.  Mood swings.  Personality changes.  Feelings of sadness (depression).  Waking up a lot during the night to pee (urinate).  Dry mouth.  Sore throat. How is this diagnosed?  Your medical  history.  A physical exam.  A test that is done when you are sleeping (sleep study). The test is most often done in a sleep lab but may also be done at home. How is this treated?   Sleeping on your side.  Using a medicine to get rid of mucus in your nose (decongestant).  Avoiding the use of alcohol, medicines to help you relax, or certain pain medicines (narcotics).  Losing weight, if needed.  Changing your diet.  Not smoking.  Using a machine to open your airway while you sleep, such as: ? An oral appliance. This is a mouthpiece that shifts your lower jaw forward. ? A CPAP device. This device blows air through a mask when you breathe out (exhale). ? An EPAP device. This has valves that you put in each nostril. ? A BPAP device. This device blows air through a mask when you breathe in (inhale) and breathe out.  Having surgery if other treatments do not work. It is important to get treatment for sleep apnea. Without treatment, it can lead to:  High blood pressure.  Coronary artery disease.  In men, not being able to have an erection (impotence).  Reduced thinking ability. Follow these instructions at home: Lifestyle  Make changes that your doctor recommends.  Eat a healthy diet.  Lose weight if needed.  Avoid alcohol, medicines to help you relax, and some pain medicines.  Do not use any products that contain nicotine or tobacco, such as cigarettes, e-cigarettes, and chewing tobacco. If you need help quitting,  ask your doctor. General instructions  Take over-the-counter and prescription medicines only as told by your doctor.  If you were given a machine to use while you sleep, use it only as told by your doctor.  If you are having surgery, make sure to tell your doctor you have sleep apnea. You may need to bring your device with you.  Keep all follow-up visits as told by your doctor. This is important. Contact a doctor if:  The machine that you were given to  use during sleep bothers you or does not seem to be working.  You do not get better.  You get worse. Get help right away if:  Your chest hurts.  You have trouble breathing in enough air.  You have an uncomfortable feeling in your back, arms, or stomach.  You have trouble talking.  One side of your body feels weak.  A part of your face is hanging down. These symptoms may be an emergency. Do not wait to see if the symptoms will go away. Get medical help right away. Call your local emergency services (911 in the [place] Do not drive yourself to the hospital. Summary  This condition affects breathing during sleep.  The most common cause is a collapsed or blocked airway.  The goal of treatment is to help you breathe normally while you sleep. This information is not intended to replace advice given to you by your health care provider. Make sure you discuss any questions you have with your health care provider. Document Revised: 12/13/2017 Document Reviewed: 10/22/2017 Elsevier Patient Education  [employer].

## 2020-04-01 ENCOUNTER — Encounter: Payer: Self-pay | Admitting: Pulmonary Disease

## 2020-04-12 IMAGING — CT CT ABDOMEN AND PELVIS WITH CONTRAST
2 of 4 series · 16 of 46 positions shown, 18 images · IV contrast (OMNIPAQUE)
Comparison: None.

CLINICAL DATA: Right lower quadrant abdominal pain.

EXAM:
CT ABDOMEN AND PELVIS WITH CONTRAST
TECHNIQUE: Multidetector CT imaging of the abdomen and pelvis was performed
using the standard protocol following bolus administration of
intravenous contrast.
CONTRAST:  100mL OMNIPAQUE IOHEXOL 300 MG/ML  SOLN

[Series 2: axial st · axial · 0.70mm/px · z∈[+1296,+1696]mm · 13 of 91 slices shown, 15 images]
[im 6/91  soft-tissue]
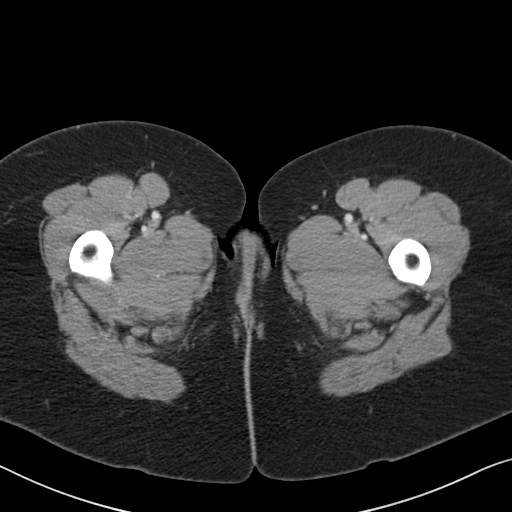
[im 6/91  bone]
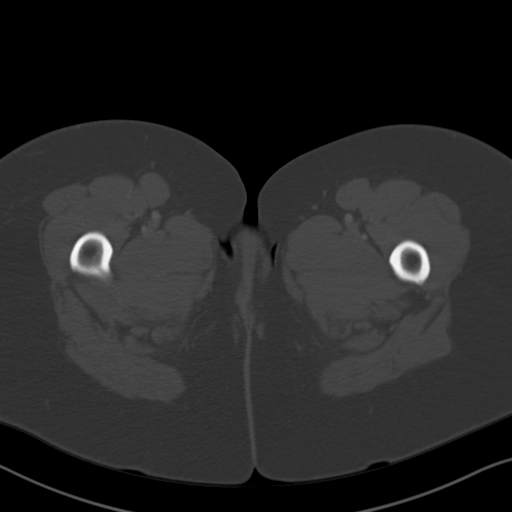
[im 11/91  soft-tissue]
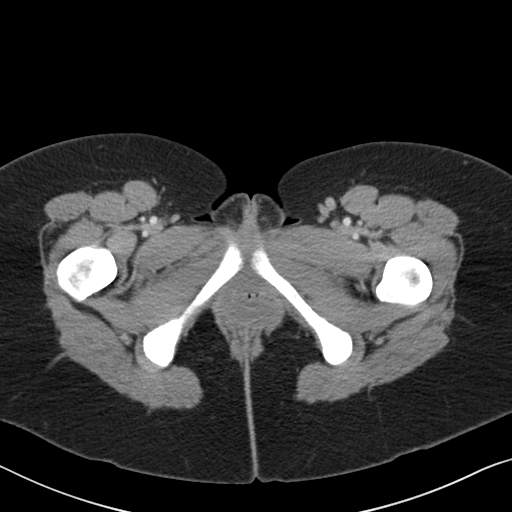
[im 21/91  soft-tissue]
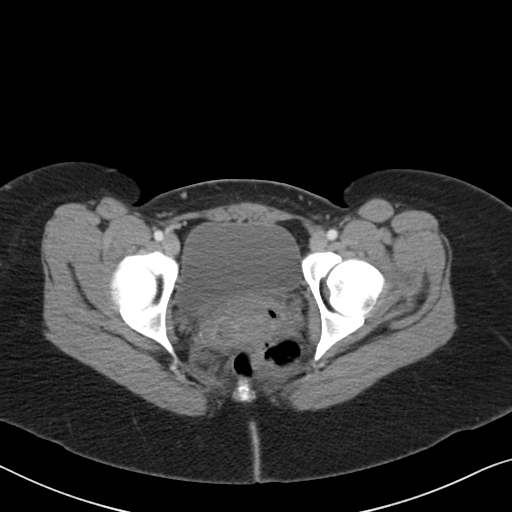
[im 26/91  soft-tissue]
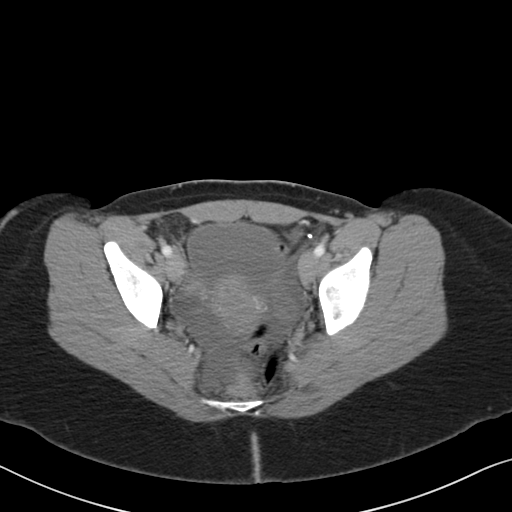
[im 31/91  soft-tissue]
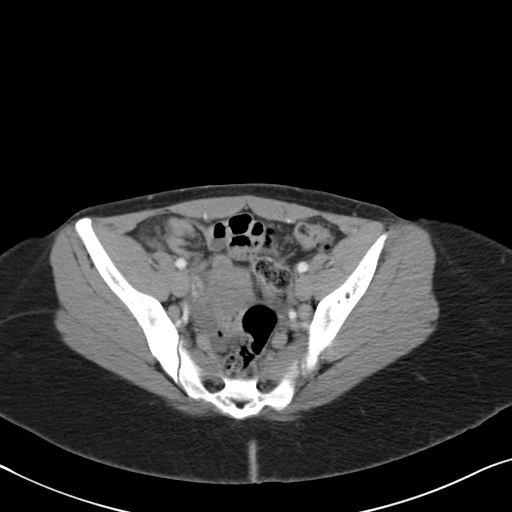
[im 41/91  soft-tissue]
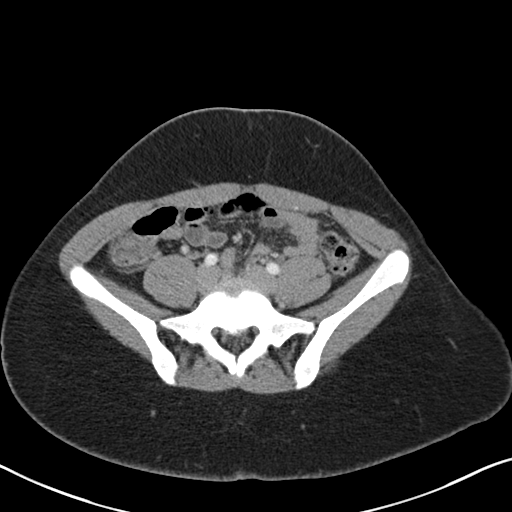
[im 46/91  soft-tissue]
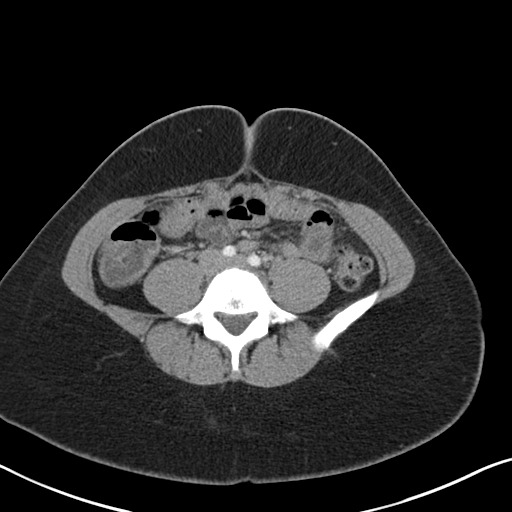
[im 51/91  soft-tissue]
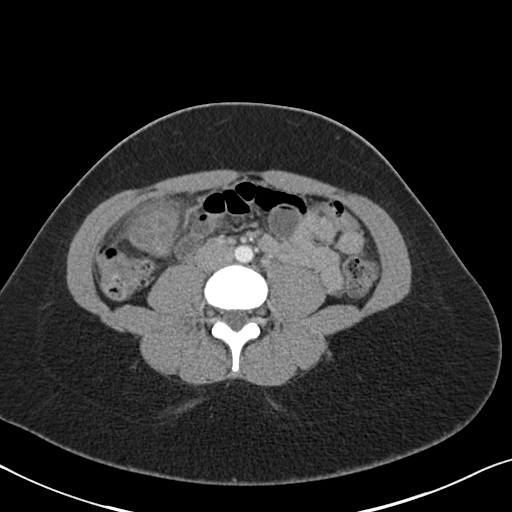
[im 61/91  soft-tissue]
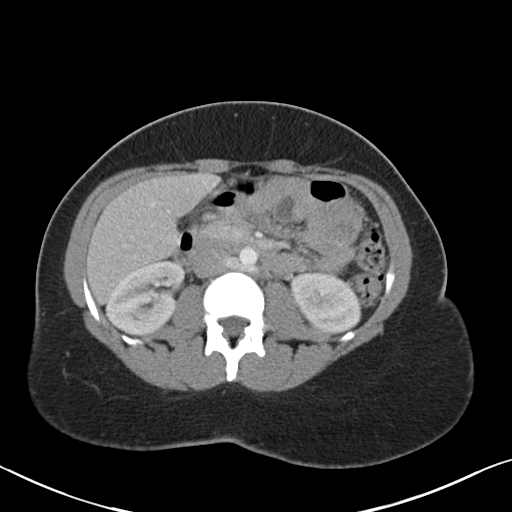
[im 61/91  bone]
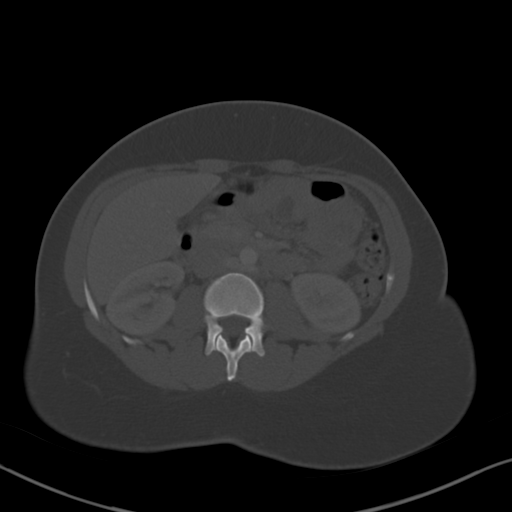
[im 66/91  soft-tissue]
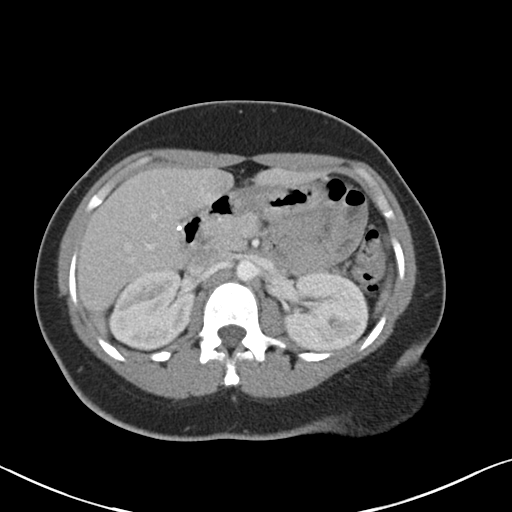
[im 71/91  soft-tissue]
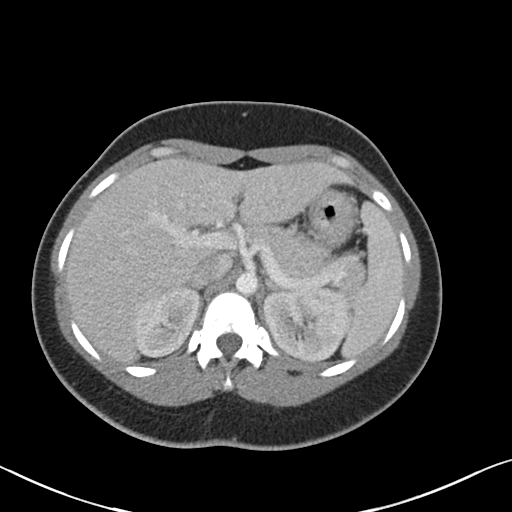
[im 81/91  soft-tissue]
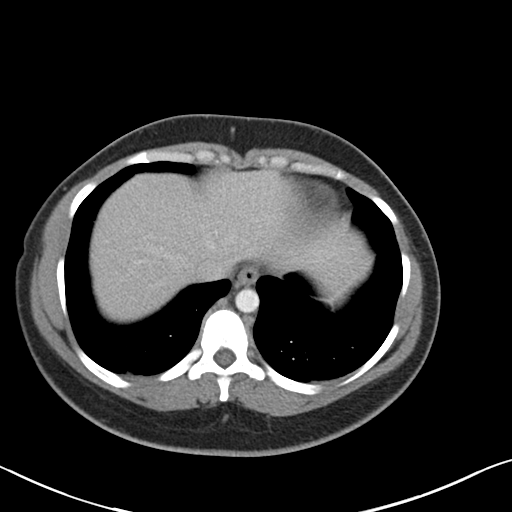
[im 86/91  soft-tissue]
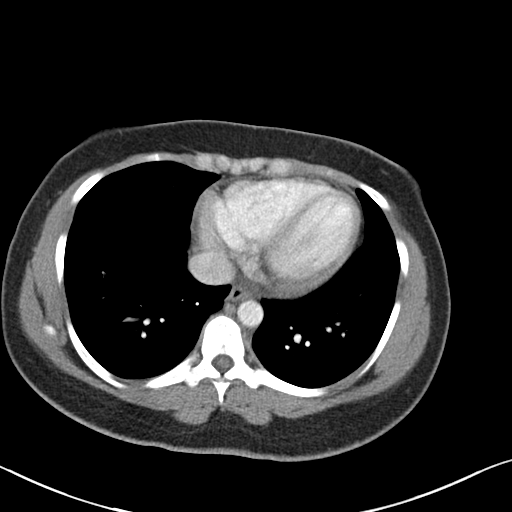

[Series 4: coronal st · coronal · 0.78mm/px · 3 of 108 slices shown]
[im 36/108  soft-tissue]
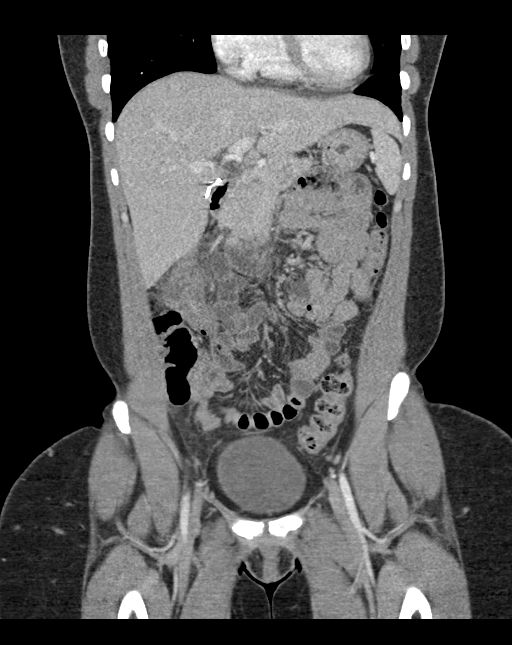
[im 48/108  soft-tissue]
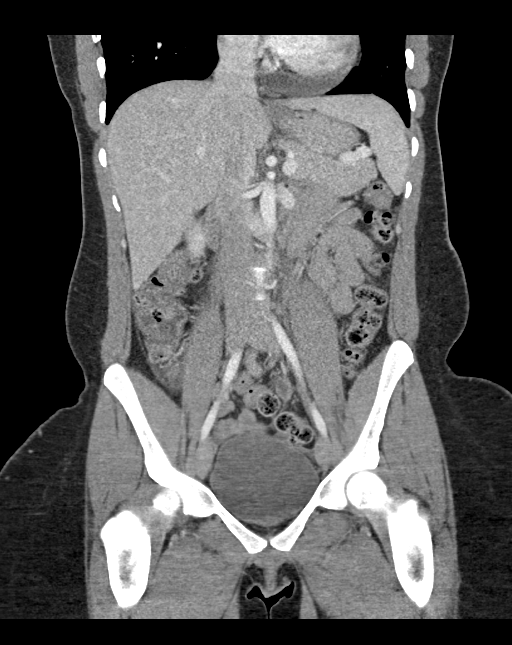
[im 60/108  soft-tissue]
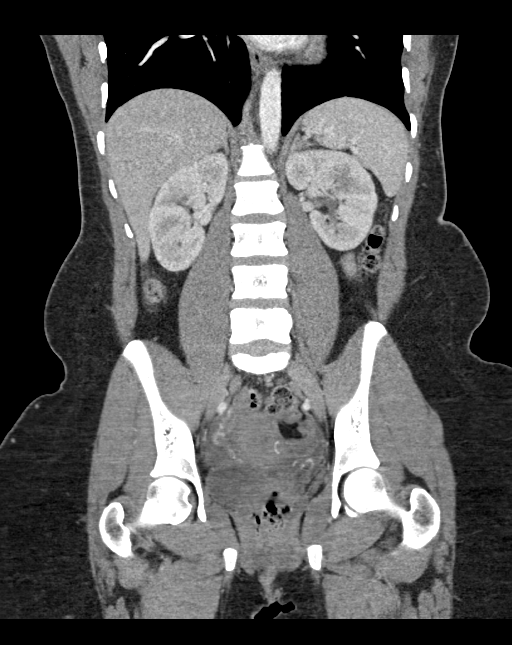

[16 of 46 positions shown; findings below may reference images not displayed]

FINDINGS: Lower chest: No acute abnormality.

Hepatobiliary: No focal liver abnormality is seen. Status post
cholecystectomy. No biliary dilatation.

Pancreas: Unremarkable. No pancreatic ductal dilatation or
surrounding inflammatory changes.

Spleen: Normal in size without focal abnormality.

Adrenals/Urinary Tract: Adrenal glands are unremarkable. Kidneys are
normal, without renal calculi, focal lesion, or hydronephrosis.
Bladder is unremarkable.

Stomach/Bowel: The stomach and appendix appear normal. Focal fold
and wall thickening is seen involving the proximal transverse colon
with mild amount of surrounding fluid suggesting focal colitis or
diverticulitis. There is no evidence of bowel obstruction.

Vascular/Lymphatic: No significant vascular findings are present. No
enlarged abdominal or pelvic lymph nodes.

Reproductive: Uterus and left adnexal region appear normal. Mild
amount of free fluid is noted in the right adnexal region and
posterior cul-de-sac of the pelvis. Also noted is 2 cm high density
rounded abnormality in right adnexal region which may represent
ruptured or involuting cyst.

Other: No hernia is noted.

Musculoskeletal: No acute or significant osseous findings.
IMPRESSION: Focal wall and fold thickening is seen involving the proximal
transverse colon with mild amount of surrounding inflammatory change
suggesting focal colitis or diverticulitis. Clinical correlation is
recommended.

2 cm rounded high density abnormality seen in right adnexal region
with mild amount of free fluid seen in the right adnexal region and
posterior cul-de-sac of the pelvis, concerning for ruptured or
involuting cyst. Pelvic ultrasound may be performed for further
evaluation.

## 2020-07-27 DIAGNOSIS — Z0000 Encounter for general adult medical examination without abnormal findings: Secondary | ICD-10-CM | POA: Diagnosis not present

## 2020-07-27 DIAGNOSIS — Z3201 Encounter for pregnancy test, result positive: Secondary | ICD-10-CM | POA: Diagnosis not present

## 2020-07-27 DIAGNOSIS — K219 Gastro-esophageal reflux disease without esophagitis: Secondary | ICD-10-CM | POA: Diagnosis not present

## 2020-07-27 DIAGNOSIS — Z131 Encounter for screening for diabetes mellitus: Secondary | ICD-10-CM | POA: Diagnosis not present

## 2020-07-27 DIAGNOSIS — K5904 Chronic idiopathic constipation: Secondary | ICD-10-CM | POA: Diagnosis not present

## 2020-07-27 DIAGNOSIS — Z113 Encounter for screening for infections with a predominantly sexual mode of transmission: Secondary | ICD-10-CM | POA: Diagnosis not present

## 2020-07-27 DIAGNOSIS — G4733 Obstructive sleep apnea (adult) (pediatric): Secondary | ICD-10-CM | POA: Diagnosis not present

## 2020-07-27 DIAGNOSIS — E6609 Other obesity due to excess calories: Secondary | ICD-10-CM | POA: Diagnosis not present

## 2020-08-15 DIAGNOSIS — Z3201 Encounter for pregnancy test, result positive: Secondary | ICD-10-CM | POA: Diagnosis not present

## 2020-08-15 DIAGNOSIS — K5904 Chronic idiopathic constipation: Secondary | ICD-10-CM | POA: Diagnosis not present

## 2020-08-15 DIAGNOSIS — E6609 Other obesity due to excess calories: Secondary | ICD-10-CM | POA: Diagnosis not present

## 2020-08-15 DIAGNOSIS — K219 Gastro-esophageal reflux disease without esophagitis: Secondary | ICD-10-CM | POA: Diagnosis not present

## 2020-08-15 DIAGNOSIS — G4733 Obstructive sleep apnea (adult) (pediatric): Secondary | ICD-10-CM | POA: Diagnosis not present

## 2020-08-15 DIAGNOSIS — R110 Nausea: Secondary | ICD-10-CM | POA: Diagnosis not present

## 2020-09-01 ENCOUNTER — Encounter (HOSPITAL_COMMUNITY): Payer: Self-pay | Admitting: *Deleted

## 2020-09-01 ENCOUNTER — Other Ambulatory Visit: Payer: Self-pay

## 2020-09-01 ENCOUNTER — Emergency Department (HOSPITAL_COMMUNITY)
Admission: EM | Admit: 2020-09-01 | Discharge: 2020-09-01 | Disposition: A | Discharge status: Home / Self Care | Payer: 59 | Attending: Emergency Medicine | Admitting: Emergency Medicine

## 2020-09-01 DIAGNOSIS — I959 Hypotension, unspecified: Secondary | ICD-10-CM | POA: Diagnosis not present

## 2020-09-01 DIAGNOSIS — K5904 Chronic idiopathic constipation: Secondary | ICD-10-CM | POA: Diagnosis not present

## 2020-09-01 DIAGNOSIS — G4733 Obstructive sleep apnea (adult) (pediatric): Secondary | ICD-10-CM | POA: Diagnosis not present

## 2020-09-01 DIAGNOSIS — E6609 Other obesity due to excess calories: Secondary | ICD-10-CM | POA: Diagnosis not present

## 2020-09-01 DIAGNOSIS — Z9889 Other specified postprocedural states: Secondary | ICD-10-CM | POA: Diagnosis not present

## 2020-09-01 DIAGNOSIS — Z87891 Personal history of nicotine dependence: Secondary | ICD-10-CM | POA: Insufficient documentation

## 2020-09-01 DIAGNOSIS — K219 Gastro-esophageal reflux disease without esophagitis: Secondary | ICD-10-CM | POA: Diagnosis not present

## 2020-09-01 DIAGNOSIS — I9589 Other hypotension: Secondary | ICD-10-CM | POA: Diagnosis not present

## 2020-09-01 LAB — TYPE AND SCREEN
ABO/RH(D): O POS
Antibody Screen: NEGATIVE

## 2020-09-01 LAB — CBC WITH DIFFERENTIAL/PLATELET
Abs Immature Granulocytes: 0.02 10*3/uL (ref 0.00–0.07)
Basophils Absolute: 0 10*3/uL (ref 0.0–0.1)
Basophils Relative: 1 %
Eosinophils Absolute: 0.1 10*3/uL (ref 0.0–0.5)
Eosinophils Relative: 1 %
HCT: 37 % (ref 36.0–46.0)
Hemoglobin: 11.6 g/dL — ABNORMAL LOW (ref 12.0–15.0)
Immature Granulocytes: 0 %
Lymphocytes Relative: 33 %
Lymphs Abs: 2.2 10*3/uL (ref 0.7–4.0)
MCH: 27.9 pg (ref 26.0–34.0)
MCHC: 31.4 g/dL (ref 30.0–36.0)
MCV: 88.9 fL (ref 80.0–100.0)
Monocytes Absolute: 0.6 10*3/uL (ref 0.1–1.0)
Monocytes Relative: 9 %
Neutro Abs: 3.7 10*3/uL (ref 1.7–7.7)
Neutrophils Relative %: 56 %
Platelets: 207 10*3/uL (ref 150–400)
RBC: 4.16 MIL/uL (ref 3.87–5.11)
RDW: 14.4 % (ref 11.5–15.5)
WBC: 6.6 10*3/uL (ref 4.0–10.5)
nRBC: 0 % (ref 0.0–0.2)

## 2020-09-01 LAB — I-STAT CHEM 8, ED
BUN: 8 mg/dL (ref 6–20)
Calcium, Ion: 1.12 mmol/L — ABNORMAL LOW (ref 1.15–1.40)
Chloride: 101 mmol/L (ref 98–111)
Creatinine, Ser: 0.6 mg/dL (ref 0.44–1.00)
Glucose, Bld: 87 mg/dL (ref 70–99)
HCT: 39 % (ref 36.0–46.0)
Hemoglobin: 13.3 g/dL (ref 12.0–15.0)
Potassium: 4.8 mmol/L (ref 3.5–5.1)
Sodium: 135 mmol/L (ref 135–145)
TCO2: 27 mmol/L (ref 22–32)

## 2020-09-01 NOTE — ED Provider Notes (Signed)
Emergency Medicine Provider Triage Evaluation Note  Tiffany Blair , a 35 y.o. female  was evaluated in triage.  Pt complains of low blood pressure.  Review of Systems  Positive: Vaginal bleeding, fatigue Negative: Abd pain, headache, cp, sob  Physical Exam  BP 122/78   Pulse (!) 54   Temp (!) 97.5 F (36.4 C) (Oral)   Resp 18   SpO2 94%  Gen:   Awake, no distress   Resp:  Normal effort  MSK:   Moves extremities without difficulty  Other:    Medical Decision Making  Medically screening exam initiated at 2:26 PM.  Appropriate orders placed.  Tiffany Blair was informed that the remainder of the evaluation will be completed by another provider, this initial triage assessment does not replace that evaluation, and the importance of remaining in the ED until their evaluation is complete.  Pt had an abortion 2 weeks ago and have been having intermittent vaginal bleeding since.  Went to see her PCP today for an FMLA form, but was found to be hypotensive with SBP in the 80s.  Sent here for further care.    Tiffany Moras, PA-C 09/01/20 1428    Horton, Alvin Critchley, DO 09/01/20 2221

## 2020-09-01 NOTE — ED Triage Notes (Addendum)
Pt states her blood pressure was low at the doctor's office today on 2 separate readings. She was sent here for evaluation and fluids. She reports having an abortion 2 weeks ago and is still having some bleeding.

## 2020-09-01 NOTE — ED Provider Notes (Signed)
[facility] DEPT Provider Note   CSN: [id number] Arrival date & time: 09/01/20  1324     History Chief Complaint  Patient presents with   Hypotension    Tiffany Blair is a 35 y.o. female.  35 year old female presents with report of low blood pressure reading at her doctor's office today.  Patient states that she took a pill to induce an abortion 2 weeks ago, has had light spotting since that time.  Patient went to her PCP office today to complete FMLA paperwork, she is feeling well otherwise and had no complaints, was found to have a blood pressure of 88/61 and was sent to the emergency room for further evaluation.  Patient denies feeling weak, dizzy, lightheaded.  No other complaints or concerns today.      Past Medical History:  Diagnosis Date   Medical history non-contributory    Pregnancy     Patient Active Problem List   Diagnosis Date Noted   Intrauterine pregnancy 07/27/2018   Subchorionic hematoma 07/27/2018   Abdominal pain during pregnancy in first trimester 07/27/2018   Ptyalism 07/27/2018   Anemia 06/30/2015   Transaminitis 06/29/2015   Calculus of gallbladder and bile duct with obstruction without cholecystitis     Past Surgical History:  Procedure Laterality Date   CHOLECYSTECTOMY N/A 07/01/2015   Procedure: LAPAROSCOPIC CHOLECYSTECTOMY WITH INTRAOPERATIVE CHOLANGIOGRAM;  Surgeon: Autumn Messing III, MD;  Location: [facility];  Service: General;  Laterality: N/A;   ERCP N/A 06/30/2015   Procedure: ENDOSCOPIC RETROGRADE CHOLANGIOPANCREATOGRAPHY (ERCP);  Surgeon: Clarene Essex, MD;  Location: [facility];  Service: Endoscopy;  Laterality: N/A;   WISDOM TOOTH EXTRACTION       OB History     Gravida  3   Para  1   Term  1   Preterm      AB  1   Living  1      SAB      IAB      Ectopic      Multiple      Live Births  1           Family History  Problem Relation Age of Onset   Colon cancer Neg Hx     Cholelithiasis Neg Hx    Diabetes Neg Hx    Hypertension Neg Hx     Social History   Tobacco Use   Smoking status: Former    Pack years: 0.00   Smokeless tobacco: Never  Vaping Use   Vaping Use: Never used  Substance Use Topics   Alcohol use: Not Currently    Comment: occ   Drug use: No    Types: Marijuana    Home Medications Prior to Admission medications   Medication Sig Start Date End Date Taking? Authorizing Provider  acetaminophen (TYLENOL) 500 MG tablet Take 1,000 mg by mouth every 8 (eight) hours as needed for mild pain or headache. pain Patient not taking: Reported on 02/17/2020    [provider]  meloxicam (MOBIC) 7.5 MG tablet Take 7.5 mg by mouth 2 (two) times daily as needed for pain.    [provider]    Allergies    Patient has no known allergies.  Review of Systems   Review of Systems  Constitutional:  Negative for fever.  Respiratory:  Negative for shortness of breath.   Cardiovascular:  Negative for chest pain.  Gastrointestinal:  Negative for abdominal pain, nausea and vomiting.  Genitourinary:  Positive for vaginal  bleeding.  Musculoskeletal:  Negative for back pain.  Skin:  Negative for rash and wound.  Allergic/Immunologic: Negative for immunocompromised state.  Neurological:  Negative for dizziness, weakness and light-headedness.  Hematological:  Does not bruise/bleed easily.  Psychiatric/Behavioral:  Negative for confusion.   All other systems reviewed and are negative.  Physical Exam Updated Vital Signs BP (!) 90/49 (BP Location: Left Arm)   Pulse (!) 58   Temp (!) 97.5 F (36.4 C) (Oral)   Resp 16   SpO2 100%   Physical Exam Vitals and nursing note reviewed.  Constitutional:      General: She is not in acute distress.    Appearance: She is well-developed. She is not diaphoretic.  HENT:     Head: Normocephalic and atraumatic.     Mouth/Throat:     Mouth: Mucous membranes are moist.  Eyes:     Extraocular  Movements: Extraocular movements intact.     Pupils: Pupils are equal, round, and reactive to light.  Cardiovascular:     Rate and Rhythm: Normal rate and regular rhythm.     Heart sounds: Normal heart sounds.  Pulmonary:     Effort: Pulmonary effort is normal.     Breath sounds: Normal breath sounds.  Abdominal:     Palpations: Abdomen is soft.     Tenderness: There is no abdominal tenderness.  Musculoskeletal:     Cervical back: Neck supple.  Skin:    General: Skin is warm and dry.     Findings: No erythema or rash.  Neurological:     Mental Status: She is alert and oriented to person, place, and time.  Psychiatric:        Behavior: Behavior normal.    ED Results / Procedures / Treatments   Labs (all labs ordered are listed, but only abnormal results are displayed) Labs Reviewed  CBC WITH DIFFERENTIAL/PLATELET - Abnormal; Notable for the following components:      Result Value   Hemoglobin 11.6 (*)    All other components within normal limits  I-STAT CHEM 8, ED - Abnormal; Notable for the following components:   Calcium, Ion 1.12 (*)    All other components within normal limits  TYPE AND SCREEN    EKG None  Radiology No results found.  Procedures Procedures   Medications Ordered in ED Medications - No data to display  ED Course  I have reviewed the triage vital signs and the nursing notes.  Pertinent labs & imaging results that were available during my care of the patient were reviewed by me and considered in my medical decision making (see chart for details).  Clinical Course as of 09/01/20 1749  Thu Sep 02, 5423  308 35 year old female sent by PCP office for low blood pressure today in the office without other complaints.  Patient arrived to the emergency room with blood pressure of 122/78, had 1 reading of 90/49 and is currently at 107/75. Review of her prior records, blood pressures tend to range in the 06Y to 694 systolic.  She is asymptomatic.  She  reports vaginal spotting x2 weeks since chemical abortion without other complaints.  Hemoglobin is found to be 11.6, CBC otherwise unremarkable. Patient is felt to be safe for discharge at this time, advised to recheck with primary care provider as needed. [LM]    Clinical Course User Index [LM] Roque Lias   MDM Rules/Calculators/A&P  Final Clinical Impression(s) / ED Diagnoses Final diagnoses:  Hypotension, unspecified hypotension type    Rx / DC Orders ED Discharge Orders     None        Roque Lias 09/01/20 1749    Lacretia Leigh, MD 09/01/20 2306

## 2020-09-06 DIAGNOSIS — E6609 Other obesity due to excess calories: Secondary | ICD-10-CM | POA: Diagnosis not present

## 2020-09-06 DIAGNOSIS — M79671 Pain in right foot: Secondary | ICD-10-CM | POA: Diagnosis not present

## 2020-09-06 DIAGNOSIS — G4733 Obstructive sleep apnea (adult) (pediatric): Secondary | ICD-10-CM | POA: Diagnosis not present

## 2020-09-06 DIAGNOSIS — M79672 Pain in left foot: Secondary | ICD-10-CM | POA: Diagnosis not present

## 2020-09-06 DIAGNOSIS — K5904 Chronic idiopathic constipation: Secondary | ICD-10-CM | POA: Diagnosis not present

## 2020-09-06 DIAGNOSIS — I9589 Other hypotension: Secondary | ICD-10-CM | POA: Diagnosis not present

## 2020-09-06 DIAGNOSIS — K219 Gastro-esophageal reflux disease without esophagitis: Secondary | ICD-10-CM | POA: Diagnosis not present

## 2020-09-19 DIAGNOSIS — K5904 Chronic idiopathic constipation: Secondary | ICD-10-CM | POA: Diagnosis not present

## 2020-09-19 DIAGNOSIS — K219 Gastro-esophageal reflux disease without esophagitis: Secondary | ICD-10-CM | POA: Diagnosis not present

## 2020-09-19 DIAGNOSIS — G4733 Obstructive sleep apnea (adult) (pediatric): Secondary | ICD-10-CM | POA: Diagnosis not present

## 2020-09-19 DIAGNOSIS — E6609 Other obesity due to excess calories: Secondary | ICD-10-CM | POA: Diagnosis not present

## 2020-09-19 DIAGNOSIS — R42 Dizziness and giddiness: Secondary | ICD-10-CM | POA: Diagnosis not present

## 2020-09-29 ENCOUNTER — Ambulatory Visit: Payer: 59 | Admitting: Podiatry

## 2020-10-03 ENCOUNTER — Other Ambulatory Visit: Payer: Self-pay

## 2020-10-03 ENCOUNTER — Ambulatory Visit (INDEPENDENT_AMBULATORY_CARE_PROVIDER_SITE_OTHER): Payer: 59

## 2020-10-03 ENCOUNTER — Ambulatory Visit (INDEPENDENT_AMBULATORY_CARE_PROVIDER_SITE_OTHER): Payer: 59 | Admitting: Podiatry

## 2020-10-03 VITALS — BP 98/60 | HR 62 | Temp 97.9°F

## 2020-10-03 DIAGNOSIS — M79671 Pain in right foot: Secondary | ICD-10-CM

## 2020-10-03 DIAGNOSIS — M7662 Achilles tendinitis, left leg: Secondary | ICD-10-CM

## 2020-10-03 DIAGNOSIS — M7661 Achilles tendinitis, right leg: Secondary | ICD-10-CM | POA: Diagnosis not present

## 2020-10-03 DIAGNOSIS — M722 Plantar fascial fibromatosis: Secondary | ICD-10-CM | POA: Diagnosis not present

## 2020-10-03 DIAGNOSIS — M79672 Pain in left foot: Secondary | ICD-10-CM

## 2020-10-03 MED ORDER — DICLOFENAC SODIUM 75 MG PO TBEC
75.0000 mg | DELAYED_RELEASE_TABLET | Freq: Two times a day (BID) | ORAL | 0 refills | Status: DC
Start: 2020-10-03 — End: 2020-11-10

## 2020-10-03 NOTE — Patient Instructions (Signed)
For instructions on how to put on your Plantar Fascial Brace, please visit [email or web address]   Plantar Fasciitis (Heel Spur Syndrome) with Rehab The plantar fascia is a fibrous, ligament-like, soft-tissue structure that spans the bottom of the foot. Plantar fasciitis is a condition that causes pain in the foot due to inflammation of the tissue. SYMPTOMS   Pain and tenderness on the underneath side of the foot.  Pain that worsens with standing or walking. CAUSES  Plantar fasciitis is caused by irritation and injury to the plantar fascia on the underneath side of the foot. Common mechanisms of injury include:  Direct trauma to bottom of the foot.  Damage to a small nerve that runs under the foot where the main fascia attaches to the heel bone.  Stress placed on the plantar fascia due to bone spurs. RISK INCREASES WITH:   Activities that place stress on the plantar fascia (running, jumping, pivoting, or cutting).  Poor strength and flexibility.  Improperly fitted shoes.  Tight calf muscles.  Flat feet.  Failure to warm-up properly before activity.  Obesity. PREVENTION  Warm up and stretch properly before activity.  Allow for adequate recovery between workouts.  Maintain physical fitness:  Strength, flexibility, and endurance.  Cardiovascular fitness.  Maintain a health body weight.  Avoid stress on the plantar fascia.  Wear properly fitted shoes, including arch supports for individuals who have flat feet.  PROGNOSIS  If treated properly, then the symptoms of plantar fasciitis usually resolve without surgery. However, occasionally surgery is necessary.  RELATED COMPLICATIONS   Recurrent symptoms that may result in a chronic condition.  Problems of the lower back that are caused by compensating for the injury, such as limping.  Pain or weakness of the foot during push-off following surgery.  Chronic inflammation, scarring, and partial or complete  fascia tear, occurring more often from repeated injections.  TREATMENT  Treatment initially involves the use of ice and medication to help reduce pain and inflammation. The use of strengthening and stretching exercises may help reduce pain with activity, especially stretches of the Achilles tendon. These exercises may be performed at home or with a therapist. Your caregiver may recommend that you use heel cups of arch supports to help reduce stress on the plantar fascia. Occasionally, corticosteroid injections are given to reduce inflammation. If symptoms persist for greater than 6 months despite non-surgical (conservative), then surgery may be recommended.   MEDICATION   If pain medication is necessary, then nonsteroidal anti-inflammatory medications, such as aspirin and ibuprofen, or other minor pain relievers, such as acetaminophen, are often recommended.  Do not take pain medication within 7 days before surgery.  Prescription pain relievers may be given if deemed necessary by your caregiver. Use only as directed and only as much as you need.  Corticosteroid injections may be given by your caregiver. These injections should be reserved for the most serious cases, because they may only be given a certain number of times.  HEAT AND COLD  Cold treatment (icing) relieves pain and reduces inflammation. Cold treatment should be applied for 10 to 15 minutes every 2 to 3 hours for inflammation and pain and immediately after any activity that aggravates your symptoms. Use ice packs or massage the area with a piece of ice (ice massage).  Heat treatment may be used prior to performing the stretching and strengthening activities prescribed by your caregiver, physical therapist, or athletic trainer. Use a heat pack or soak the injury in warm water.  SEEK IMMEDIATE MEDICAL   CARE IF:  Treatment seems to offer no benefit, or the condition worsens.  Any medications produce adverse side effects.   EXERCISES- RANGE OF MOTION (ROM) AND STRETCHING EXERCISES - Plantar Fasciitis (Heel Spur Syndrome) These exercises may help you when beginning to rehabilitate your injury. Your symptoms may resolve with or without further involvement from your physician, physical therapist or athletic trainer. While completing these exercises, remember:   Restoring tissue flexibility helps normal motion to return to the joints. This allows healthier, less painful movement and activity.  An effective stretch should be held for at least 30 seconds.  A stretch should never be painful. You should only feel a gentle lengthening or release in the stretched tissue.  RANGE OF MOTION - Toe Extension, Flexion  Sit with your right / left leg crossed over your opposite knee.  Grasp your toes and gently pull them back toward the top of your foot. You should feel a stretch on the bottom of your toes and/or foot.  Hold this stretch for 10 seconds.  Now, gently pull your toes toward the bottom of your foot. You should feel a stretch on the top of your toes and or foot.  Hold this stretch for 10 seconds. Repeat  times. Complete this stretch 3 times per day.   RANGE OF MOTION - Ankle Dorsiflexion, Active Assisted  Remove shoes and sit on a chair that is preferably not on a carpeted surface.  Place right / left foot under knee. Extend your opposite leg for support.  Keeping your heel down, slide your right / left foot back toward the chair until you feel a stretch at your ankle or calf. If you do not feel a stretch, slide your bottom forward to the edge of the chair, while still keeping your heel down.  Hold this stretch for 10 seconds. Repeat 3 times. Complete this stretch 2 times per day.   STRETCH  Gastroc, Standing  Place hands on wall.  Extend right / left leg, keeping the front knee somewhat bent.  Slightly point your toes inward on your back foot.  Keeping your right / left heel on the floor and your  knee straight, shift your weight toward the wall, not allowing your back to arch.  You should feel a gentle stretch in the right / left calf. Hold this position for 10 seconds. Repeat 3 times. Complete this stretch 2 times per day.  STRETCH  Soleus, Standing  Place hands on wall.  Extend right / left leg, keeping the other knee somewhat bent.  Slightly point your toes inward on your back foot.  Keep your right / left heel on the floor, bend your back knee, and slightly shift your weight over the back leg so that you feel a gentle stretch deep in your back calf.  Hold this position for 10 seconds. Repeat 3 times. Complete this stretch 2 times per day.  STRETCH  Gastrocsoleus, Standing  Note: This exercise can place a lot of stress on your foot and ankle. Please complete this exercise only if specifically instructed by your caregiver.   Place the ball of your right / left foot on a step, keeping your other foot firmly on the same step.  Hold on to the wall or a rail for balance.  Slowly lift your other foot, allowing your body weight to press your heel down over the edge of the step.  You should feel a stretch in your right / left calf.  Hold this   position for 10 seconds.  Repeat this exercise with a slight bend in your right / left knee. Repeat 3 times. Complete this stretch 2 times per day.   STRENGTHENING EXERCISES - Plantar Fasciitis (Heel Spur Syndrome)  These exercises may help you when beginning to rehabilitate your injury. They may resolve your symptoms with or without further involvement from your physician, physical therapist or athletic trainer. While completing these exercises, remember:   Muscles can gain both the endurance and the strength needed for everyday activities through controlled exercises.  Complete these exercises as instructed by your physician, physical therapist or athletic trainer. Progress the resistance and repetitions only as guided.  STRENGTH -  Towel Curls  Sit in a chair positioned on a non-carpeted surface.  Place your foot on a towel, keeping your heel on the floor.  Pull the towel toward your heel by only curling your toes. Keep your heel on the floor. Repeat 3 times. Complete this exercise 2 times per day.  STRENGTH - Ankle Inversion  Secure one end of a rubber exercise band/tubing to a fixed object (table, pole). Loop the other end around your foot just before your toes.  Place your fists between your knees. This will focus your strengthening at your ankle.  Slowly, pull your big toe up and in, making sure the band/tubing is positioned to resist the entire motion.  Hold this position for 10 seconds.  Have your muscles resist the band/tubing as it slowly pulls your foot back to the starting position. Repeat 3 times. Complete this exercises 2 times per day.  Document Released: 02/26/2005 Document Revised: 05/21/2011 Document Reviewed: 06/10/2008 [employer] Patient Information 2014 [employer]. 

## 2020-10-06 NOTE — Progress Notes (Signed)
Subjective:   Patient ID: Tiffany Blair, female   DOB: 35 y.o.   MRN: [id number]   HPI 35 year old female presents the office with concerns of bilateral heel pain which is been ongoing for the last few years.  She says it hurts most when she first gets up or if she has been sitting for some time she stands back up.  She states it does not really hurt if she keeps moving.  She has tried changing shoes.  She works a lot standing up which aggravates her symptoms.  No other treatment.  No rating pain or weakness.  No other concerns.   ROS  Past Medical History:  Diagnosis Date   Medical history non-contributory    Pregnancy     Past Surgical History:  Procedure Laterality Date   CHOLECYSTECTOMY N/A 07/01/2015   Procedure: LAPAROSCOPIC CHOLECYSTECTOMY WITH INTRAOPERATIVE CHOLANGIOGRAM;  Surgeon: Autumn Messing III, MD;  Location: [facility];  Service: General;  Laterality: N/A;   ERCP N/A 06/30/2015   Procedure: ENDOSCOPIC RETROGRADE CHOLANGIOPANCREATOGRAPHY (ERCP);  Surgeon: Clarene Essex, MD;  Location: [facility];  Service: Endoscopy;  Laterality: N/A;   WISDOM TOOTH EXTRACTION       Current Outpatient Medications:    diclofenac (VOLTAREN) 75 MG EC tablet, Take 1 tablet (75 mg total) by mouth 2 (two) times daily., Disp: 30 tablet, Rfl: 0   acetaminophen (TYLENOL) 500 MG tablet, Take 1,000 mg by mouth every 8 (eight) hours as needed for mild pain or headache. pain (Patient not taking: Reported on 02/17/2020), Disp: , Rfl:    meloxicam (MOBIC) 7.5 MG tablet, Take 7.5 mg by mouth 2 (two) times daily as needed for pain., Disp: , Rfl:   No Known Allergies       Objective:  Physical Exam  General: AAO x3, NAD  Dermatological: Skin is warm, dry and supple bilateral.  There are no open sores, no preulcerative lesions, no rash or signs of infection present.  Vascular: Dorsalis Pedis artery and Posterior Tibial artery pedal pulses are 2/4 bilateral with immedate capillary fill time.  There is  no pain with calf compression, swelling, warmth, erythema.   Neruologic: Grossly intact via light touch bilateral. Negative tinel sign.   Musculoskeletal: There is tenderness palpation on plantar aspect the calcaneus and the insertion of plantar fascial mildly to the posterior aspect the calcaneus along the insertion of the Achilles tendon.  Thompson test is negative.  There is no pain with lateral compression of the calcaneus.  No edema, erythema.  Flexor, extensor tendons appear to be intact.  Muscular strength 5/5 in all groups tested bilateral.  Gait: Unassisted, Nonantalgic.       Assessment:   Plantar fasciitis, insertional Achilles tendinitis bilaterally     Plan:  -Treatment options discussed including all alternatives, risks, and complications -Etiology of symptoms were discussed -X-rays were obtained and reviewed with the patient.  There is no evidence of acute fracture or stress fracture identified today. -Offered steroid injection but we held off on this.  Prescribe meloxicam discussed side effects.  Plantar fascial brace dispensed x2.  Power step inserts.  Discussed shoe modifications as well.  Home stretching, icing daily.  Trula Slade DPM

## 2020-11-10 ENCOUNTER — Encounter: Payer: Self-pay | Admitting: Neurology

## 2020-11-10 ENCOUNTER — Ambulatory Visit (INDEPENDENT_AMBULATORY_CARE_PROVIDER_SITE_OTHER): Payer: 59 | Admitting: Neurology

## 2020-11-10 VITALS — BP 95/63 | HR 53 | Ht 62.0 in | Wt 179.0 lb

## 2020-11-10 DIAGNOSIS — H81399 Other peripheral vertigo, unspecified ear: Secondary | ICD-10-CM

## 2020-11-10 DIAGNOSIS — K219 Gastro-esophageal reflux disease without esophagitis: Secondary | ICD-10-CM | POA: Diagnosis not present

## 2020-11-10 DIAGNOSIS — R42 Dizziness and giddiness: Secondary | ICD-10-CM | POA: Diagnosis not present

## 2020-11-10 DIAGNOSIS — K5904 Chronic idiopathic constipation: Secondary | ICD-10-CM | POA: Diagnosis not present

## 2020-11-10 DIAGNOSIS — G44219 Episodic tension-type headache, not intractable: Secondary | ICD-10-CM

## 2020-11-10 DIAGNOSIS — G4733 Obstructive sleep apnea (adult) (pediatric): Secondary | ICD-10-CM | POA: Diagnosis not present

## 2020-11-10 DIAGNOSIS — E6609 Other obesity due to excess calories: Secondary | ICD-10-CM | POA: Diagnosis not present

## 2020-11-10 MED ORDER — MAGNESIUM 400 MG PO TABS: 400.0000 mg | ORAL_TABLET | Freq: Every evening | ORAL | 3 refills | Status: AC

## 2020-11-10 MED ORDER — MECLIZINE HCL 12.5 MG PO TABS: 12.5000 mg | ORAL_TABLET | Freq: Three times a day (TID) | ORAL | 0 refills | Status: AC | PRN

## 2020-11-10 MED ORDER — RIBOFLAVIN 400 MG PO CAPS: 400.0000 mg | ORAL_CAPSULE | Freq: Every evening | ORAL | 3 refills | Status: AC

## 2020-11-10 NOTE — Patient Instructions (Addendum)
MRI Brain with and without contrast  Meclizine as needed for the episode  Trial of Magnesium and Riboflavin for the headaches Return to clinic as needed

## 2020-11-10 NOTE — Progress Notes (Signed)
[facility]  PATIENT: Tiffany Blair DOB: 04-01-1985  REFERRING CLINICIAN: Trey Sailors, PA HISTORY FROM: Patient  REASON FOR VISIT: Dizziness    HISTORICAL  CHIEF COMPLAINT:  Chief Complaint  Patient presents with   New Patient (Initial Visit)    Rm 12, alone, c/o dizziness while moving     HISTORY OF PRESENT ILLNESS:  This is a 35 year old woman with no past medical history who is presenting for complaint of dizziness.  Patient stated dizziness started about 6 months ago, she will notice it more when writing on the board or when going from a sitting to a standing position.  Dizziness lasts less than a minute then will go away.  She denies any hearing loss, denies any tinnitus and describe dizziness as being off balance, no falls.  In June she follow-up with her primary care doctor, was prescribed meclizine but has not tried it.  Reported dizziness is still ongoing.  She denies any history of brain tumor, denies any family history of brain tumor.  Patient also complains of headaches, described the headache as pressure type headache, like "something is not right with my head".  The pain usually on the right side, she gets up and about twice a week and during this time she cannot think straight.  She denies any nausea, no vomiting, sometimes she has sensitivity to light.  She usually takes Advil for the pain.  Reported sister has history of migraines. She work for [employer], is at home with her 39 year old daughter reports that stress is manageable and her sleep is okay, she will get about 7 hours/ night   OTHER MEDICAL CONDITIONS: Back pain, plantar fascitis, headaches    REVIEW OF SYSTEMS: Full 14 system review of systems performed and negative with exception of: as noted in the HPI  ALLERGIES: No Known Allergies  HOME MEDICATIONS: Outpatient Medications Prior to Visit  Medication Sig Dispense Refill   acetaminophen (TYLENOL) 500 MG tablet  Take 1,000 mg by mouth every 8 (eight) hours as needed for mild pain or headache. pain (Patient not taking: Reported on 02/17/2020)     diclofenac (VOLTAREN) 75 MG EC tablet Take 1 tablet (75 mg total) by mouth 2 (two) times daily. 30 tablet 0   meloxicam (MOBIC) 7.5 MG tablet Take 7.5 mg by mouth 2 (two) times daily as needed for pain.     No facility-administered medications prior to visit.    PAST MEDICAL HISTORY: Past Medical History:  Diagnosis Date   Medical history non-contributory    Pregnancy     PAST SURGICAL HISTORY: Past Surgical History:  Procedure Laterality Date   CHOLECYSTECTOMY N/A 07/01/2015   Procedure: LAPAROSCOPIC CHOLECYSTECTOMY WITH INTRAOPERATIVE CHOLANGIOGRAM;  Surgeon: Autumn Messing III, MD;  Location: [facility];  Service: General;  Laterality: N/A;   ERCP N/A 06/30/2015   Procedure: ENDOSCOPIC RETROGRADE CHOLANGIOPANCREATOGRAPHY (ERCP);  Surgeon: Clarene Essex, MD;  Location: [facility];  Service: Endoscopy;  Laterality: N/A;   WISDOM TOOTH EXTRACTION      FAMILY HISTORY: Family History  Problem Relation Age of Onset   Colon cancer Neg Hx    Cholelithiasis Neg Hx    Diabetes Neg Hx    Hypertension Neg Hx     SOCIAL HISTORY: Social History   Socioeconomic History   Marital status: Single    Spouse name: Not on file   Number of children: Not on file   Years of education: Not on file   Highest education level: Not  on file  Occupational History   Not on file  Tobacco Use   Smoking status: Former   Smokeless tobacco: Never  Vaping Use   Vaping Use: Never used  Substance and Sexual Activity   Alcohol use: Not Currently    Comment: occ   Drug use: No    Types: Marijuana   Sexual activity: Yes    Birth control/protection: None  Other Topics Concern   Not on file  Social History Narrative   Not on file   Social Determinants of Health   Financial Resource Strain: Not on file  Food Insecurity: Not on file  Transportation Needs: Not on file   Physical Activity: Not on file  Stress: Not on file  Social Connections: Not on file  Intimate Partner Violence: Not on file     PHYSICAL EXAM  GENERAL EXAM/CONSTITUTIONAL: Vitals:  Vitals:   11/10/20 0856  BP: 95/63  Pulse: (!) 53  Weight: 179 lb (81.2 kg)  Height: '5\' 2"'$  (1.575 m)   Body mass index is 32.74 kg/m. Wt Readings from Last 3 Encounters:  11/10/20 179 lb (81.2 kg)  02/17/20 170 lb (77.1 kg)  07/27/18 170 lb 14.4 oz (77.5 kg)   Patient is in no distress; well developed, nourished and groomed; neck is supple  CARDIOVASCULAR: Examination of carotid arteries is normal; no carotid bruits Regular rate and rhythm, no murmurs Examination of peripheral vascular system by observation and palpation is normal  EYES: Pupils round and reactive to light, Visual fields full to confrontation, Extraocular movements intacts,   MUSCULOSKELETAL: Gait, strength, tone, movements noted in Neurologic exam below  NEUROLOGIC: MENTAL STATUS:  awake, alert, oriented to person, place and time recent and remote memory intact normal attention and concentration language fluent, comprehension intact, naming intact fund of knowledge appropriate  CRANIAL NERVE:  2nd - no papilledema or hemorrhages on fundoscopic exam 2nd, 3rd, 4th, 6th - pupils equal and reactive to light, visual fields full to confrontation, extraocular muscles intact, no nystagmus 5th - facial sensation symmetric 7th - facial strength symmetric 8th - hearing intact 9th - palate elevates symmetrically, uvula midline 11th - shoulder shrug symmetric 12th - tongue protrusion midline  MOTOR:  normal bulk and tone, full strength in the BUE, BLE  SENSORY:  normal and symmetric to light touch, pinprick, temperature, vibration  COORDINATION:  finger-nose-finger, fine finger movements normal  REFLEXES:  deep tendon reflexes present and symmetric  GAIT/STATION:  normal    DIAGNOSTIC DATA (LABS, IMAGING,  TESTING) - I reviewed patient records, labs, notes, testing and imaging myself where available.  Lab Results  Component Value Date   WBC 6.6 09/01/2020   HGB 13.3 09/01/2020   HCT 39.0 09/01/2020   MCV 88.9 09/01/2020   PLT 207 09/01/2020      Component Value Date/Time   NA 135 09/01/2020 1727   K 4.8 09/01/2020 1727   CL 101 09/01/2020 1727   CO2 25 06/11/2018 0738   GLUCOSE 87 09/01/2020 1727   BUN 8 09/01/2020 1727   CREATININE 0.60 09/01/2020 1727   CALCIUM 8.7 (L) 06/11/2018 0738   PROT 7.7 06/11/2018 0738   ALBUMIN 3.9 06/11/2018 0738   AST 19 06/11/2018 0738   ALT 16 06/11/2018 0738   ALKPHOS 51 06/11/2018 0738   BILITOT 0.7 06/11/2018 0738   GFRNONAA >60 06/11/2018 0738   GFRAA >60 06/11/2018 0738   No results found for: CHOL, HDL, LDLCALC, LDLDIRECT, TRIG, CHOLHDL No results found for: HGBA1C Lab Results  Component Value Date   [id number] 07/01/2015   No results found for: TSH      ASSESSMENT AND PLAN  35 y.o. year old female with no past medical history who is presenting with complaint of dizziness, reported it is positional has been going on for more than 66-month  She has not tried any medication for it.  Denies any hearing loss, denies any tinnitus.  She also have history of headaches usually will get 2 headaches days per week.  Headaches are described as pressure type pain on the right side of her face,.  She usually takes Advil for the pain she has not tried any preventive medication.  Family history of headache include sister with migraine headaches.  Because of the headaches and the dizziness I will go ahead and order MRI brain, I will restart her on meclizine to take as needed for the dizziness.  I will also recommend riboflavin and magnesium to take nightly for headaches.   1. Dizziness   2. Other peripheral vertigo, unspecified ear   3. Episodic tension-type headache, not intractable      PLAN: MRI Brain with and without contrast   Meclizine as needed for the episode  Trial of Magnesium and Riboflavin for the headaches Return to clinic as needed or symptoms not resolved or worse   Orders Placed This Encounter  Procedures   MR BRAIN W WO CONTRAST    Meds ordered this encounter  Medications   meclizine (ANTIVERT) 12.5 MG tablet    Sig: Take 1 tablet (12.5 mg total) by mouth 3 (three) times daily as needed for dizziness.    Dispense:  30 tablet    Refill:  0   Magnesium 400 MG TABS    Sig: Take 400 mg by mouth at bedtime.    Dispense:  30 tablet    Refill:  3   Riboflavin 400 MG CAPS    Sig: Take 1 capsule (400 mg total) by mouth at bedtime.    Dispense:  30 capsule    Refill:  3    Return if symptoms worsen or fail to improve.    AAlric Ran MD 11/10/2020, 9:59 AM  [facility] [address] [place] [phone]

## 2020-11-15 ENCOUNTER — Telehealth: Payer: Self-pay | Admitting: Neurology

## 2020-11-15 NOTE — Telephone Encounter (Signed)
[facility] no auth require  [facility] [place]: [id number] (exp. 11/15/20 to 12/30/20) order sent to GI. They will reach out to the patient to schedule.

## 2021-01-17 DIAGNOSIS — G4733 Obstructive sleep apnea (adult) (pediatric): Secondary | ICD-10-CM | POA: Diagnosis not present

## 2021-01-17 DIAGNOSIS — K5904 Chronic idiopathic constipation: Secondary | ICD-10-CM | POA: Diagnosis not present

## 2021-01-17 DIAGNOSIS — E6609 Other obesity due to excess calories: Secondary | ICD-10-CM | POA: Diagnosis not present

## 2021-01-17 DIAGNOSIS — M778 Other enthesopathies, not elsewhere classified: Secondary | ICD-10-CM | POA: Diagnosis not present

## 2021-01-17 DIAGNOSIS — K219 Gastro-esophageal reflux disease without esophagitis: Secondary | ICD-10-CM | POA: Diagnosis not present

## 2021-04-01 ENCOUNTER — Encounter (HOSPITAL_COMMUNITY): Payer: Self-pay

## 2021-04-01 ENCOUNTER — Emergency Department (HOSPITAL_COMMUNITY): Payer: BC Managed Care – PPO

## 2021-04-01 ENCOUNTER — Encounter (HOSPITAL_COMMUNITY): Payer: Self-pay | Admitting: Emergency Medicine

## 2021-04-01 ENCOUNTER — Ambulatory Visit (HOSPITAL_COMMUNITY)
Admission: EM | Admit: 2021-04-01 | Discharge: 2021-04-01 | Payer: BC Managed Care – PPO | Attending: Medical Oncology | Admitting: Medical Oncology

## 2021-04-01 ENCOUNTER — Emergency Department (HOSPITAL_COMMUNITY)
Admission: EM | Admit: 2021-04-01 | Discharge: 2021-04-01 | Disposition: A | Discharge status: Home / Self Care | Payer: BC Managed Care – PPO | Attending: Student | Admitting: Student

## 2021-04-01 ENCOUNTER — Other Ambulatory Visit: Payer: Self-pay

## 2021-04-01 DIAGNOSIS — R197 Diarrhea, unspecified: Secondary | ICD-10-CM | POA: Insufficient documentation

## 2021-04-01 DIAGNOSIS — K219 Gastro-esophageal reflux disease without esophagitis: Secondary | ICD-10-CM | POA: Diagnosis not present

## 2021-04-01 DIAGNOSIS — R1084 Generalized abdominal pain: Secondary | ICD-10-CM

## 2021-04-01 DIAGNOSIS — Z20822 Contact with and (suspected) exposure to covid-19: Secondary | ICD-10-CM | POA: Insufficient documentation

## 2021-04-01 DIAGNOSIS — R0789 Other chest pain: Secondary | ICD-10-CM | POA: Diagnosis not present

## 2021-04-01 DIAGNOSIS — N9489 Other specified conditions associated with female genital organs and menstrual cycle: Secondary | ICD-10-CM | POA: Insufficient documentation

## 2021-04-01 DIAGNOSIS — R509 Fever, unspecified: Secondary | ICD-10-CM | POA: Diagnosis not present

## 2021-04-01 DIAGNOSIS — R112 Nausea with vomiting, unspecified: Secondary | ICD-10-CM

## 2021-04-01 DIAGNOSIS — R1013 Epigastric pain: Secondary | ICD-10-CM | POA: Diagnosis not present

## 2021-04-01 DIAGNOSIS — R109 Unspecified abdominal pain: Secondary | ICD-10-CM | POA: Diagnosis not present

## 2021-04-01 DIAGNOSIS — R110 Nausea: Secondary | ICD-10-CM | POA: Diagnosis not present

## 2021-04-01 DIAGNOSIS — R079 Chest pain, unspecified: Secondary | ICD-10-CM | POA: Diagnosis not present

## 2021-04-01 HISTORY — DX: Gastro-esophageal reflux disease without esophagitis: K21.9

## 2021-04-01 LAB — POCT URINALYSIS DIPSTICK, ED / UC
Bilirubin Urine: NEGATIVE
Glucose, UA: NEGATIVE mg/dL
Hgb urine dipstick: NEGATIVE
Ketones, ur: NEGATIVE mg/dL
Leukocytes,Ua: NEGATIVE
Nitrite: NEGATIVE
Protein, ur: 30 mg/dL — AB
Specific Gravity, Urine: >=1.03 (ref 1.005–1.030)
Urobilinogen, UA: 0.2 mg/dL (ref 0.0–1.0)
pH: 5.5 (ref 5.0–8.0)

## 2021-04-01 LAB — CBC
HCT: 37.7 % (ref 36.0–46.0)
Hemoglobin: 12 g/dL (ref 12.0–15.0)
MCH: 27.3 pg (ref 26.0–34.0)
MCHC: 31.8 g/dL (ref 30.0–36.0)
MCV: 85.7 fL (ref 80.0–100.0)
Platelets: 267 10*3/uL (ref 150–400)
RBC: 4.4 MIL/uL (ref 3.87–5.11)
RDW: 13.7 % (ref 11.5–15.5)
WBC: 5.2 10*3/uL (ref 4.0–10.5)
nRBC: 0 % (ref 0.0–0.2)

## 2021-04-01 LAB — URINALYSIS, ROUTINE W REFLEX MICROSCOPIC
Bilirubin Urine: NEGATIVE
Glucose, UA: NEGATIVE mg/dL
Hgb urine dipstick: NEGATIVE
Ketones, ur: NEGATIVE mg/dL
Leukocytes,Ua: NEGATIVE
Nitrite: NEGATIVE
Protein, ur: NEGATIVE mg/dL
Specific Gravity, Urine: 1.028 (ref 1.005–1.030)
pH: 5 (ref 5.0–8.0)

## 2021-04-01 LAB — COMPREHENSIVE METABOLIC PANEL
ALT: 23 U/L (ref 0–44)
AST: 25 U/L (ref 15–41)
Albumin: 4.3 g/dL (ref 3.5–5.0)
Alkaline Phosphatase: 61 U/L (ref 38–126)
Anion gap: 10 (ref 5–15)
BUN: 14 mg/dL (ref 6–20)
CO2: 24 mmol/L (ref 22–32)
Calcium: 9.2 mg/dL (ref 8.9–10.3)
Chloride: 102 mmol/L (ref 98–111)
Creatinine, Ser: 0.73 mg/dL (ref 0.44–1.00)
GFR, Estimated: >60 mL/min (ref >60)
Glucose, Bld: 93 mg/dL (ref 70–99)
Potassium: 3.6 mmol/L (ref 3.5–5.1)
Sodium: 136 mmol/L (ref 135–145)
Total Bilirubin: 0.4 mg/dL (ref 0.3–1.2)
Total Protein: 8.7 g/dL — ABNORMAL HIGH (ref 6.5–8.1)

## 2021-04-01 LAB — I-STAT BETA HCG BLOOD, ED (MC, WL, AP ONLY): I-stat hCG, quantitative: <5 m[IU]/mL (ref <5)

## 2021-04-01 LAB — POC URINE PREG, ED: Preg Test, Ur: NEGATIVE

## 2021-04-01 LAB — RESP PANEL BY RT-PCR (FLU A&B, COVID) ARPGX2
Influenza A by PCR: NEGATIVE
Influenza B by PCR: NEGATIVE
SARS Coronavirus 2 by RT PCR: NEGATIVE

## 2021-04-01 LAB — TROPONIN I (HIGH SENSITIVITY): Troponin I (High Sensitivity): 2 ng/L (ref <18)

## 2021-04-01 LAB — LIPASE, BLOOD: Lipase: 26 U/L (ref 11–51)

## 2021-04-01 MED ORDER — ONDANSETRON HCL 4 MG PO TABS: 4.0000 mg | ORAL_TABLET | Freq: Four times a day (QID) | ORAL | 0 refills | Status: DC

## 2021-04-01 MED ORDER — LIDOCAINE VISCOUS HCL 2 % MT SOLN
15.0000 mL | Freq: Once | OROMUCOSAL | Status: AC
  Administered 2021-04-01: 15 mL via ORAL
  Filled 2021-04-01: qty 15

## 2021-04-01 MED ORDER — SODIUM CHLORIDE 0.9 % IV BOLUS
1000.0000 mL | Freq: Once | INTRAVENOUS | Status: AC
  Administered 2021-04-01: 1000 mL via INTRAVENOUS

## 2021-04-01 MED ORDER — ALUM & MAG HYDROXIDE-SIMETH 200-200-20 MG/5ML PO SUSP
30.0000 mL | Freq: Once | ORAL | Status: AC
  Administered 2021-04-01: 30 mL via ORAL
  Filled 2021-04-01: qty 30

## 2021-04-01 MED ORDER — IOHEXOL 300 MG/ML  SOLN
100.0000 mL | Freq: Once | INTRAMUSCULAR | Status: AC | PRN
  Administered 2021-04-01: 100 mL via INTRAVENOUS

## 2021-04-01 MED ORDER — MYLANTA MAXIMUM STRENGTH 400-400-40 MG/5ML PO SUSP
30.0000 mL | Freq: Four times a day (QID) | ORAL | 0 refills | Status: AC | PRN
Start: 2021-04-01 — End: ?

## 2021-04-01 MED ORDER — FAMOTIDINE IN NACL 20-0.9 MG/50ML-% IV SOLN
20.0000 mg | Freq: Once | INTRAVENOUS | Status: AC
Start: 2021-04-01 — End: 2021-04-01
  Administered 2021-04-01: 20 mg via INTRAVENOUS
  Filled 2021-04-01: qty 50

## 2021-04-01 MED ORDER — FAMOTIDINE 20 MG PO TABS: 20.0000 mg | ORAL_TABLET | Freq: Every day | ORAL | 0 refills | Status: AC

## 2021-04-01 MED ORDER — KETOROLAC TROMETHAMINE 30 MG/ML IJ SOLN
30.0000 mg | Freq: Once | INTRAMUSCULAR | Status: AC
  Administered 2021-04-01: 30 mg via INTRAVENOUS
  Filled 2021-04-01: qty 1

## 2021-04-01 MED ORDER — ONDANSETRON 4 MG PO TBDP
4.0000 mg | ORAL_TABLET | Freq: Once | ORAL | Status: AC
  Administered 2021-04-01: 4 mg via ORAL
  Filled 2021-04-01: qty 1

## 2021-04-01 NOTE — ED Provider Notes (Signed)
[facility] DEPT Provider Note   CSN: [id number] Arrival date & time: 04/01/21  1718     History  Chief Complaint  Patient presents with   Abdominal Pain   Nausea   Chest Pain    Tiffany Blair is a 36 y.o. female with a PMHx of acid reflux, who presents to the ED complaining of epigastric abdominal pain onset 3 days.  Denies sick contacts.  Has associated epigastric chest pain, nausea, diarrhea (watery, yellow, 4 episodes today).  She noticed her sternal chest pain upon waking up 3 days ago.  She has not tried any medications for her chest pain.  Has tried prescription pantoprazole that she takes as needed with minimal improvement of her symptoms.  Patient notes recent food intake the day before her episode of diarrhea that was a food that she doesn't consume often. Denies sick contacts.  Vital signs stable, no elevated white count, patient afebrile. Denies fever, chills, dysuria, hematuria, vaginal bleeding, vaginal discharge, blood in stool, dark tarry stool,.  Patient's last menstrual period was 2-3 weeks ago. Patient had her gallbladder removed 6 years ago.  Denies history of MI, diabetes, hypertension, CAD, cardiac catheterization, or stents.    Per patient chart review: Patient was evaluated in urgent care today for her symptoms and informed to come to the ED for further evaluation of her symptoms.  The history is provided by the patient. No language interpreter was used.      Home Medications Prior to Admission medications   Medication Sig Start Date End Date Taking? Authorizing Provider  alum & mag hydroxide-simeth (MYLANTA MAXIMUM STRENGTH) 400-400-40 MG/5ML suspension Take 30 mLs by mouth every 6 (six) hours as needed for indigestion. 04/01/21  Yes Alenah Sarria A, PA-C  famotidine (PEPCID) 20 MG tablet Take 1 tablet (20 mg total) by mouth daily. 04/01/21  Yes Kendyl Bissonnette A, PA-C  ondansetron (ZOFRAN) 4 MG tablet Take 1 tablet (4 mg total)  by mouth every 6 (six) hours. 04/01/21  Yes Markeis Allman A, PA-C  Magnesium 400 MG TABS Take 400 mg by mouth at bedtime. 11/10/20   Alric Ran, MD  meclizine (ANTIVERT) 12.5 MG tablet Take 1 tablet (12.5 mg total) by mouth 3 (three) times daily as needed for dizziness. 11/10/20   Alric Ran, MD      Allergies    Patient has no known allergies.    Review of Systems   Review of Systems  Constitutional:  Negative for chills and fever.  Respiratory:  Negative for shortness of breath.   Cardiovascular:  Positive for chest pain (Sternal).  Gastrointestinal:  Positive for abdominal pain, diarrhea and nausea. Negative for blood in stool and vomiting.  Genitourinary:  Negative for dysuria, hematuria, vaginal bleeding and vaginal discharge.  Skin:  Negative for rash.  All other systems reviewed and are negative.  Physical Exam Updated Vital Signs BP 113/77 (BP Location: Left Arm)    Pulse 75    Temp 98.1 F (36.7 C) (Oral)    Resp 16    Ht 5\' 2"  (1.575 m)    Wt 81.6 kg    SpO2 100%    BMI 32.92 kg/m  Physical Exam Vitals and nursing note reviewed.  Constitutional:      General: She is not in acute distress.    Appearance: She is not diaphoretic.  HENT:     Head: Normocephalic and atraumatic.     Mouth/Throat:     Pharynx: No oropharyngeal exudate.  Eyes:     General: No scleral icterus.    Conjunctiva/sclera: Conjunctivae normal.  Cardiovascular:     Rate and Rhythm: Normal rate and regular rhythm.     Pulses: Normal pulses.     Heart sounds: Normal heart sounds.  Pulmonary:     Effort: Pulmonary effort is normal. No respiratory distress.     Breath sounds: Normal breath sounds. No wheezing.  Chest:     Comments: No chest wall tenderness to palpation. Abdominal:     General: Bowel sounds are normal.     Palpations: Abdomen is soft. There is no mass.     Tenderness: There is abdominal tenderness in the right lower quadrant, epigastric area and suprapubic area. There is no  right CVA tenderness, left CVA tenderness, guarding or rebound.     Comments: Mild tenderness to palpation to suprapubic, epigastric, right lower region.  No overlying skin changes. No CVA tenderness noted bilaterally.  Musculoskeletal:        General: Normal range of motion.     Cervical back: Normal range of motion and neck supple.  Skin:    General: Skin is warm and dry.  Neurological:     Mental Status: She is alert.  Psychiatric:        Behavior: Behavior normal.    ED Results / Procedures / Treatments   Labs (all labs ordered are listed, but only abnormal results are displayed) Labs Reviewed  URINALYSIS, ROUTINE W REFLEX MICROSCOPIC - Abnormal; Notable for the following components:      Result Value   APPearance HAZY (*)    All other components within normal limits  COMPREHENSIVE METABOLIC PANEL - Abnormal; Notable for the following components:   Total Protein 8.7 (*)    All other components within normal limits  RESP PANEL BY RT-PCR (FLU A&B, COVID) ARPGX2  CBC  LIPASE, BLOOD  I-STAT BETA HCG BLOOD, ED ([facility], [facility], [facility] ONLY)  I-STAT BETA HCG BLOOD, ED ([facility], [facility], [facility] ONLY)  TROPONIN I (HIGH SENSITIVITY)    EKG None  Radiology DG Chest 2 View  Result Date: 04/01/2021 CLINICAL DATA:  Acute chest pain and nausea. EXAM: CHEST - 2 VIEW COMPARISON:  06/11/2018 and prior radiographs FINDINGS: The cardiomediastinal silhouette is unremarkable. Mild peribronchial thickening is unchanged. There is no evidence of focal airspace disease, pulmonary edema, suspicious pulmonary nodule/mass, pleural effusion, or pneumothorax. No acute bony abnormalities are identified. IMPRESSION: No active cardiopulmonary disease. Electronically Signed   By: Margarette Canada M.D.   On: 04/01/2021 18:52   CT ABDOMEN PELVIS W CONTRAST  Result Date: 04/01/2021 CLINICAL DATA:  Abdominal pain, acute, nonlocalized EXAM: CT ABDOMEN AND PELVIS WITH CONTRAST TECHNIQUE: Multidetector CT imaging of the abdomen and pelvis was  performed using the standard protocol following bolus administration of intravenous contrast. RADIATION DOSE REDUCTION: This exam was performed according to the departmental dose-optimization program which includes automated exposure control, adjustment of the mA and/or kV according to patient size and/or use of iterative reconstruction technique. CONTRAST:  139mL OMNIPAQUE IOHEXOL 300 MG/ML  SOLN COMPARISON:  06/11/2018 FINDINGS: Lower chest: Lung bases are clear. No effusions. Heart is normal size. Hepatobiliary: No focal liver abnormality is seen. Status post cholecystectomy. No biliary dilatation. Pancreas: No focal abnormality or ductal dilatation. Spleen: No focal abnormality.  Normal size. Adrenals/Urinary Tract: No adrenal abnormality. No focal renal abnormality. No stones or hydronephrosis. Urinary bladder is unremarkable. Stomach/Bowel: Normal appendix. Stomach, large and small bowel grossly unremarkable. Vascular/Lymphatic: No evidence of aneurysm or  adenopathy. Reproductive: Uterus and adnexa unremarkable.  No mass. Other: Small amount of free fluid in the pelvis.  No free air. Musculoskeletal: No acute bony abnormality. IMPRESSION: No acute findings in the abdomen or pelvis. Electronically Signed   By: Rolm Baptise M.D.   On: 04/01/2021 20:33    Procedures Procedures    Medications Ordered in ED Medications  alum & mag hydroxide-simeth (MAALOX/MYLANTA) 200-200-20 MG/5ML suspension 30 mL (30 mLs Oral Given 04/01/21 1813)    And  lidocaine (XYLOCAINE) 2 % viscous mouth solution 15 mL (15 mLs Oral Given 04/01/21 1813)  famotidine (PEPCID) IVPB 20 mg premix (0 mg Intravenous Stopped 04/01/21 1857)  sodium chloride 0.9 % bolus 1,000 mL (0 mLs Intravenous Stopped 04/01/21 2058)  ondansetron (ZOFRAN-ODT) disintegrating tablet 4 mg (4 mg Oral Given 04/01/21 1848)  iohexol (OMNIPAQUE) 300 MG/ML solution 100 mL (100 mLs Intravenous Contrast Given 04/01/21 2019)  ketorolac (TORADOL) 30 MG/ML injection 30  mg (30 mg Intravenous Given 04/01/21 2100)    ED Course/ Medical Decision Making/ A&P Clinical Course as of 04/01/21 2214  Sat Apr 01, 2021  1954 Patient reevaluated and noted improvement of symptoms with GI cocktail. [SB]  2133 Discussed with patient at length regarding lab and imaging findings. Re-evaluated and noted improvement of symptoms with treatment regimen. Discussed discharge treatment plan. Pt agreeable at this time. Pt appears safe for discharge. [SB]    Clinical Course User Index [SB] Brigitte Soderberg A, PA-C                           Medical Decision Making Amount and/or Complexity of Data Reviewed Labs: ordered. Radiology: ordered.  Risk OTC drugs. Prescription drug management.   Patient presents to the emergency department with epigastric abdominal pain, nausea, vomiting, diarrhea onset 3 days.  Patient also with epigastric/sternal chest pain.  Denies sick contacts.  Patient with a history of GERD and as needed use of her pantoprazole.  Vital signs stable, patient afebrile, not tachycardic, not hypoxic.  On exam patient with mild epigastric tenderness to palpation.  No chest wall tenderness to palpation.  No acute cardiovascular or respiratory exam findings.  Patient with a cholecystectomy approximately 6 years ago.  Differential diagnosis includes pancreatitis, UTI, GERD, ACS, pneumonia, COVID, flu.   Labs:  I ordered, and personally interpreted labs.  The pertinent results include:   CBC without leukocytosis. CMP unremarkable. I-STAT beta-hCG negative. Urinalysis without infection. Initial troponin 2. Lipase of 26. COVID and flu swab negative.  Imaging: I ordered imaging studies including chest x-ray, CT abdomen pelvis with contrast. I independently visualized and interpreted imaging which showed:  Chest x-ray without acute cardiopulmonary findings. CT abdomen pelvis with contrast with no acute findings in the abdomen or pelvis. I agree with the radiologist  interpretation  Medications:  I ordered medication including GI cocktail, Zofran, IV fluids. Reevaluation of the patient after these medicines showed that the patient improved I have reviewed the patients home medicines and have made adjustments as needed  Reevaluation: After the interventions noted above, I reevaluated the patient and found that they have :improved.   Disposition: Patient presentation suspicious for symptoms in the setting of GERD exacerbation due to alleviation of symptoms with GI cocktail in the ED.  Doubt acute cystitis, urinalysis negative for infection.  Doubt pancreatitis, lipase within normal limits.  Doubt ACS at this time, initial troponin negative, EKG without acute ST/T changes.  Doubt pneumonia, patient chest x-ray without acute  findings.  Doubt COVID or flu, swab negative in the ED.  After consideration of the diagnostic results and the patients response to treatment, I feel that the patent would benefit from discharge home with prescription of Zofran, Pepcid, Mylanta.  Also instructed patient regarding follow-up with primary care provider on Monday, 04/03/2021.  Supportive care measures and strict return precautions discussed with patient at bedside. Pt acknowledges and verbalizes understanding. Pt appears safe for discharge. Follow up as indicated in discharge paperwork.    This chart was dictated using voice recognition software, Dragon. Despite the best efforts of this provider to proofread and correct errors, errors may still occur which can change documentation meaning.   Final Clinical Impression(s) / ED Diagnoses Final diagnoses:  Gastroesophageal reflux disease, unspecified whether esophagitis present  Nausea vomiting and diarrhea  Other chest pain    Rx / DC Orders ED Discharge Orders          Ordered    famotidine (PEPCID) 20 MG tablet  Daily        04/01/21 2142    alum & mag hydroxide-simeth (MYLANTA MAXIMUM STRENGTH) 400-400-40 MG/5ML  suspension  Every 6 hours PRN        04/01/21 2142    ondansetron (ZOFRAN) 4 MG tablet  Every 6 hours        04/01/21 2142              Telesha Deguzman A, PA-C 04/01/21 2214    Teressa Lower, MD 04/01/21 [phone]

## 2021-04-01 NOTE — ED Triage Notes (Signed)
Friday had abd cramping nausea and diarrhea. Reports shooting pain on right side of head as well. Pt reports having central chest and epigastric pains that radiates to her back that caused her to not be able to sleep last night. Pain didn't relieve after taking acid reflux medications.  Reports had her gallbladder removed. Denies vomiting.

## 2021-04-01 NOTE — Discharge Instructions (Addendum)
It was a pleasure taking care of you today!  Your labs today were unremarkable.  Your urinalysis did not show infection.  Your CT scan and chest x-ray were both unremarkable.  Your symptoms were treated in the ED today with Pepcid and Maalox and Zofran.  You will be sent a prescription for Pepcid, Maalox, Zofran.  Take as prescribed.  Call your primary care provider on Monday, 04/03/2021 for follow-up regarding today's ED visit.  Return to the ED if you are experiencing increasing/worsening abdominal pain, vomiting, inability to keep fluids down, or worsening symptoms.

## 2021-04-01 NOTE — ED Notes (Signed)
Patient is being discharged from the Urgent Care and sent to the Emergency Department via personal vehicle . Per Provider Pecola Leisure, patient is in need of higher level of care due to needing further evaluation for abdominal pain. Patient is aware and verbalizes understanding of plan of care.   Vitals:   04/01/21 1612  BP: 96/62  Pulse: 79  Resp: 17  Temp: 99.4 F (37.4 C)  SpO2: 97%

## 2021-04-01 NOTE — ED Provider Notes (Signed)
[facility]    CSN: [id number] Arrival date & time: 04/01/21  1533      History   Chief Complaint Chief Complaint  Patient presents with   Abdominal Pain    HPI Tiffany Blair is a 36 y.o. female.   HPI  Abdominal Pain: Patient reports that since last night she has had epigastric abdominal pain that radiates to her back along with some epigastric chest pain, abdominal cramping, nausea and diarrhea.  She states that symptoms feel very similar to when she had to have her gallbladder removed 6 years ago.  She states that she tried taking acid reflux medications to see if this would help symptoms with little improvement.  She states that she feels very bad.  She has had low-grade fevers.  No dysuria, urinary frequency or flank pain.  LMP: 2-3 weeks ago   Past Medical History:  Diagnosis Date   Acid reflux    Medical history non-contributory    Pregnancy     Patient Active Problem List   Diagnosis Date Noted   Intrauterine pregnancy 07/27/2018   Subchorionic hematoma 07/27/2018   Abdominal pain during pregnancy in first trimester 07/27/2018   Ptyalism 07/27/2018   Anemia 06/30/2015   Transaminitis 06/29/2015   Calculus of gallbladder and bile duct with obstruction without cholecystitis     Past Surgical History:  Procedure Laterality Date   CHOLECYSTECTOMY N/A 07/01/2015   Procedure: LAPAROSCOPIC CHOLECYSTECTOMY WITH INTRAOPERATIVE CHOLANGIOGRAM;  Surgeon: Autumn Messing III, MD;  Location: [facility];  Service: General;  Laterality: N/A;   ERCP N/A 06/30/2015   Procedure: ENDOSCOPIC RETROGRADE CHOLANGIOPANCREATOGRAPHY (ERCP);  Surgeon: Clarene Essex, MD;  Location: [facility];  Service: Endoscopy;  Laterality: N/A;   WISDOM TOOTH EXTRACTION      OB History     Gravida  3   Para  1   Term  1   Preterm      AB  1   Living  1      SAB      IAB      Ectopic      Multiple      Live Births  1            Home Medications    Prior to  Admission medications   Medication Sig Start Date End Date Taking? Authorizing Provider  Magnesium 400 MG TABS Take 400 mg by mouth at bedtime. 11/10/20   Alric Ran, MD  meclizine (ANTIVERT) 12.5 MG tablet Take 1 tablet (12.5 mg total) by mouth 3 (three) times daily as needed for dizziness. 11/10/20   Alric Ran, MD    Family History Family History  Problem Relation Age of Onset   Colon cancer Neg Hx    Cholelithiasis Neg Hx    Diabetes Neg Hx    Hypertension Neg Hx     Social History Social History   Tobacco Use   Smoking status: Former   Smokeless tobacco: Never  Scientific laboratory technician Use: Never used  Substance Use Topics   Alcohol use: Not Currently    Comment: occ   Drug use: No    Types: Marijuana     Allergies   Patient has no known allergies.   Review of Systems Review of Systems  As stated above in HPI Physical Exam Triage Vital Signs ED Triage Vitals  Enc Vitals Group     BP 04/01/21 1612 96/62     Pulse Rate 04/01/21 1612 79  Resp 04/01/21 1612 17     Temp 04/01/21 1612 99.4 F (37.4 C)     Temp Source 04/01/21 1612 Oral     SpO2 04/01/21 1612 97 %     Weight --      Height --      Head Circumference --      Peak Flow --      Pain Score 04/01/21 1609 7     Pain Loc --      Pain Edu? --      Excl. in [place]? --    No data found.  Updated Vital Signs BP 96/62 (BP Location: Left Arm)    Pulse 79    Temp 99.4 F (37.4 C) (Oral)    Resp 17    SpO2 97%    Physical Exam Vitals and nursing note reviewed.  Constitutional:      General: She is not in acute distress.    Appearance: She is well-developed. She is ill-appearing and toxic-appearing. She is not diaphoretic.  HENT:     Head: Normocephalic and atraumatic.     Mouth/Throat:     Mouth: Mucous membranes are moist.     Pharynx: Oropharynx is clear.  Eyes:     General: No scleral icterus.    Extraocular Movements: Extraocular movements intact.     Pupils: Pupils are equal, round,  and reactive to light.  Cardiovascular:     Rate and Rhythm: Normal rate and regular rhythm.     Heart sounds: Normal heart sounds.  Pulmonary:     Effort: Pulmonary effort is normal.     Breath sounds: Normal breath sounds.  Abdominal:     General: Bowel sounds are decreased. There is distension (mild).     Palpations: There is no hepatomegaly, splenomegaly, mass or pulsatile mass.     Tenderness: There is generalized abdominal tenderness (Moderate throughout but worse in the epigastric and right upper and lower quadrant. Reproducible abdominal/chest tenderness).     Hernia: No hernia is present.  Skin:    General: Skin is warm.     Coloration: Skin is not jaundiced or pale.  Neurological:     Mental Status: She is alert and oriented to person, place, and time.     UC Treatments / Results  Labs (all labs ordered are listed, but only abnormal results are displayed) Labs Reviewed - No data to display  EKG   Radiology No results found.  Procedures Procedures (including critical care time)  Medications Ordered in UC Medications - No data to display  Initial Impression / Assessment and Plan / UC Course  I have reviewed the triage vital signs and the nursing notes.  Pertinent labs & imaging results that were available during my care of the patient were reviewed by me and considered in my medical decision making (see chart for details).     New.  I am concerned about her symptoms and her low-grade fever.  I have recommended that we have her go to the emergency room for further work-up as she appears to need CT scan imaging and blood work.  I discussed this with patient.  UA and U pregnant office today prior to going to the ER. She will be NPO Final Clinical Impressions(s) / UC Diagnoses   Final diagnoses:  None   Discharge Instructions   None    ED Prescriptions   None    PDMP not reviewed this encounter.   Hughie Closs, [place] 04/01/21 1634

## 2021-04-01 NOTE — ED Triage Notes (Signed)
Pt presents via POV for chest and abd pain along with nausea and diarrhea since Thursday. Pt states that it feels like her previous gall stones, but has since had a cholecystectomy. Ambulatory in triage.

## 2021-04-04 ENCOUNTER — Emergency Department (HOSPITAL_COMMUNITY): Payer: BC Managed Care – PPO

## 2021-04-04 ENCOUNTER — Encounter (HOSPITAL_COMMUNITY): Payer: Self-pay

## 2021-04-04 ENCOUNTER — Emergency Department (HOSPITAL_COMMUNITY)
Admission: EM | Admit: 2021-04-04 | Discharge: 2021-04-04 | Disposition: A | Discharge status: Home / Self Care | Payer: BC Managed Care – PPO | Attending: Student | Admitting: Student

## 2021-04-04 ENCOUNTER — Other Ambulatory Visit: Payer: Self-pay

## 2021-04-04 DIAGNOSIS — R001 Bradycardia, unspecified: Secondary | ICD-10-CM | POA: Diagnosis not present

## 2021-04-04 DIAGNOSIS — R7989 Other specified abnormal findings of blood chemistry: Secondary | ICD-10-CM | POA: Insufficient documentation

## 2021-04-04 DIAGNOSIS — R072 Precordial pain: Secondary | ICD-10-CM | POA: Insufficient documentation

## 2021-04-04 DIAGNOSIS — R0789 Other chest pain: Secondary | ICD-10-CM

## 2021-04-04 DIAGNOSIS — R079 Chest pain, unspecified: Secondary | ICD-10-CM | POA: Diagnosis not present

## 2021-04-04 LAB — CBC WITH DIFFERENTIAL/PLATELET
Abs Immature Granulocytes: 0.03 10*3/uL (ref 0.00–0.07)
Basophils Absolute: 0 10*3/uL (ref 0.0–0.1)
Basophils Relative: 0 %
Eosinophils Absolute: 0.1 10*3/uL (ref 0.0–0.5)
Eosinophils Relative: 1 %
HCT: 38.1 % (ref 36.0–46.0)
Hemoglobin: 11.9 g/dL — ABNORMAL LOW (ref 12.0–15.0)
Immature Granulocytes: 0 %
Lymphocytes Relative: 9 %
Lymphs Abs: 0.8 10*3/uL (ref 0.7–4.0)
MCH: 27.2 pg (ref 26.0–34.0)
MCHC: 31.2 g/dL (ref 30.0–36.0)
MCV: 87.2 fL (ref 80.0–100.0)
Monocytes Absolute: 0.6 10*3/uL (ref 0.1–1.0)
Monocytes Relative: 7 %
Neutro Abs: 7.2 10*3/uL (ref 1.7–7.7)
Neutrophils Relative %: 83 %
Platelets: 263 10*3/uL (ref 150–400)
RBC: 4.37 MIL/uL (ref 3.87–5.11)
RDW: 13.6 % (ref 11.5–15.5)
WBC: 8.6 10*3/uL (ref 4.0–10.5)
nRBC: 0 % (ref 0.0–0.2)

## 2021-04-04 LAB — BASIC METABOLIC PANEL
Anion gap: 8 (ref 5–15)
BUN: 9 mg/dL (ref 6–20)
CO2: 26 mmol/L (ref 22–32)
Calcium: 8.8 mg/dL — ABNORMAL LOW (ref 8.9–10.3)
Chloride: 106 mmol/L (ref 98–111)
Creatinine, Ser: 0.72 mg/dL (ref 0.44–1.00)
GFR, Estimated: >60 mL/min (ref >60)
Glucose, Bld: 92 mg/dL (ref 70–99)
Potassium: 3.7 mmol/L (ref 3.5–5.1)
Sodium: 140 mmol/L (ref 135–145)

## 2021-04-04 LAB — TROPONIN I (HIGH SENSITIVITY)
Troponin I (High Sensitivity): 4 ng/L (ref <18)
Troponin I (High Sensitivity): 4 ng/L (ref <18)

## 2021-04-04 LAB — I-STAT BETA HCG BLOOD, ED (MC, WL, AP ONLY): I-stat hCG, quantitative: <5 m[IU]/mL (ref <5)

## 2021-04-04 LAB — BRAIN NATRIURETIC PEPTIDE: B Natriuretic Peptide: 216.4 pg/mL — ABNORMAL HIGH (ref 0.0–100.0)

## 2021-04-04 MED ORDER — OXYCODONE-ACETAMINOPHEN 5-325 MG PO TABS
1.0000 | ORAL_TABLET | Freq: Once | ORAL | Status: AC
  Administered 2021-04-04: 21:00:00 1 via ORAL
  Filled 2021-04-04: qty 1

## 2021-04-04 NOTE — Discharge Instructions (Addendum)
Your workup today was overall reassuring. Your labs were normal aside from one value called your BNP which was slightly elevated. This is a value associated with your heart, although it can be elevated for other reasons as well. I have given you a referral to a cardiologist that you should follow up with. I also recommend keeping your appt with your primary care doctor tomorrow and discuss this with her.   Please return if you develop shortness of breath, worsening chest pain or numbness/tingling.

## 2021-04-04 NOTE — ED Triage Notes (Signed)
Patient c/o left chest pain and nausea that started  approx 4 hours ago.

## 2021-04-04 NOTE — ED Provider Notes (Signed)
[facility] DEPT Provider Note   CSN: [id number] Arrival date & time: 04/04/21  1217     History { Chief Complaint  Patient presents with   Chest Pain   Nausea    Tiffany Blair is a 36 y.o. female with history significant for GERD who presents to the ED for evaluation of substernal chest pain that started yesterday and is worsening since onset.  Patient was seen in the ED on 04/01/2021 for similar symptoms however was diagnosed with acid reflux.  Patient's insist that symptoms are different today feel more of a pressure on the left side of her chest.  No treatment prior to arrival.  No alleviating factors, however she states that movement or deep inspiration worsens her symptoms.  She denies cough, shortness of breath, abdominal pain, N/V/D.  She has no known trauma or injury.  Chest Pain Associated symptoms: no abdominal pain, no fever, no headache, no shortness of breath and no vomiting       Home Medications Prior to Admission medications   Medication Sig Start Date End Date Taking? Authorizing Provider  alum & mag hydroxide-simeth (MYLANTA MAXIMUM STRENGTH) 400-400-40 MG/5ML suspension Take 30 mLs by mouth every 6 (six) hours as needed for indigestion. 04/01/21   Blue, Soijett A, PA-C  famotidine (PEPCID) 20 MG tablet Take 1 tablet (20 mg total) by mouth daily. 04/01/21   Blue, Soijett A, PA-C  Magnesium 400 MG TABS Take 400 mg by mouth at bedtime. 11/10/20   Alric Ran, MD  meclizine (ANTIVERT) 12.5 MG tablet Take 1 tablet (12.5 mg total) by mouth 3 (three) times daily as needed for dizziness. 11/10/20   Alric Ran, MD  ondansetron (ZOFRAN) 4 MG tablet Take 1 tablet (4 mg total) by mouth every 6 (six) hours. 04/01/21   Blue, Soijett A, PA-C      Allergies    Patient has no known allergies.    Review of Systems   Review of Systems  Constitutional:  Negative for fever.  HENT: Negative.    Eyes: Negative.   Respiratory:  Negative for  shortness of breath.   Cardiovascular:  Positive for chest pain.  Gastrointestinal:  Negative for abdominal pain and vomiting.  Endocrine: Negative.   Genitourinary: Negative.   Musculoskeletal: Negative.   Skin:  Negative for rash.  Neurological:  Negative for headaches.  All other systems reviewed and are negative.  Physical Exam Updated Vital Signs BP 111/78    Pulse (!) 59    Temp 98.8 F (37.1 C) (Oral)    Resp 16    Ht 5\' 2"  (1.575 m)    Wt 80.7 kg    LMP 04/04/2021    SpO2 100%    BMI 32.56 kg/m  Physical Exam Vitals and nursing note reviewed.  Constitutional:      General: She is not in acute distress.    Appearance: She is not ill-appearing.     Comments: Appears in discomfort otherwise nontoxic-appearing  HENT:     Head: Atraumatic.  Eyes:     Conjunctiva/sclera: Conjunctivae normal.  Cardiovascular:     Rate and Rhythm: Regular rhythm. Bradycardia present.     Pulses: Normal pulses.          Radial pulses are 2+ on the right side and 2+ on the left side.       Dorsalis pedis pulses are 2+ on the right side and 2+ on the left side.     Heart sounds: No murmur  heard.    Comments: Heart rate with regular rhythm heart rate with regular rate and rhythm, no murmurs rubs or gallops.  Chest wall without focal tenderness.  Pain seems to be aggravated upon movement. Pulmonary:     Effort: Pulmonary effort is normal. No respiratory distress.     Breath sounds: Normal breath sounds.     Comments: Lungs CTA bilaterally.  No accessory muscle usage or respiratory distress Abdominal:     General: Abdomen is flat. There is no distension.     Palpations: Abdomen is soft.     Tenderness: There is no abdominal tenderness.  Musculoskeletal:        General: Normal range of motion.     Cervical back: Normal range of motion.  Skin:    General: Skin is warm and dry.     Capillary Refill: Capillary refill takes less than 2 seconds.  Neurological:     General: No focal deficit present.      Mental Status: She is alert.  Psychiatric:        Mood and Affect: Mood normal.    ED Results / Procedures / Treatments   Labs (all labs ordered are listed, but only abnormal results are displayed) Labs Reviewed  BASIC METABOLIC PANEL - Abnormal; Notable for the following components:      Result Value   Calcium 8.8 (*)    All other components within normal limits  CBC WITH DIFFERENTIAL/PLATELET - Abnormal; Notable for the following components:   Hemoglobin 11.9 (*)    All other components within normal limits  BRAIN NATRIURETIC PEPTIDE - Abnormal; Notable for the following components:   B Natriuretic Peptide 216.4 (*)    All other components within normal limits  I-STAT BETA HCG BLOOD, ED ([facility], [facility], [facility] ONLY)  TROPONIN I (HIGH SENSITIVITY)  TROPONIN I (HIGH SENSITIVITY)    EKG None  Radiology DG Chest 2 View  Result Date: 04/04/2021 CLINICAL DATA:  Chest pain. EXAM: CHEST - 2 VIEW COMPARISON:  April 01, 2021. FINDINGS: The heart size and mediastinal contours are within normal limits. Both lungs are clear. The visualized skeletal structures are unremarkable. IMPRESSION: No active cardiopulmonary disease. Electronically Signed   By: Marijo Conception M.D.   On: 04/04/2021 13:09    Procedures Procedures    Medications Ordered in ED Medications - No data to display  ED Course/ Medical Decision Making/ A&P                           Medical Decision Making Amount and/or Complexity of Data Reviewed Labs: ordered. Radiology: ordered.  Risk Prescription drug management.   History:  Tiffany Blair is a 36 y.o. female with history significant for GERD who presents to the ED for evaluation of substernal chest pain that started yesterday and is worsening since onset.  Patient was seen in the ED on 04/01/2021 for similar symptoms however was diagnosed with acid reflux.  Patient's insist that symptoms are different today feel more of a pressure on the left side of her  chest.  No treatment prior to arrival.  No alleviating factors, however she states that movement or deep inspiration worsens her symptoms.  She denies cough, shortness of breath, abdominal pain, N/V/D.  She has no known trauma or injury. External records from outside source obtained and reviewed including recent ED discharge summaries, chest xray and labs  This patient presents to the ED for concern of chest pain,  this involves an extensive number of treatment options, and is a complaint that carries with it a high risk of complications and morbidity.   The emergent differential diagnosis of chest pain includes: Acute coronary syndrome, pericarditis, aortic dissection, pulmonary embolism, tension pneumothorax, and esophageal rupture. I do not believe the patient has an emergent cause of chest pain, other urgent/non-acute considerations include, but are not limited to: chronic angina, aortic stenosis, cardiomyopathy, myocarditis, mitral valve prolapse, pulmonary hypertension, hypertrophic obstructive cardiomyopathy (HOCM), aortic insufficiency, right ventricular hypertrophy, pneumonia, pleuritis, bronchitis, pneumothorax, tumor, gastroesophageal reflux disease (GERD), esophageal spasm, Mallory-Weiss syndrome, peptic ulcer disease, biliary disease, pancreatitis, functional gastrointestinal pain, cervical or thoracic disk disease or arthritis, shoulder arthritis, costochondritis, subacromial bursitis, anxiety or panic attack, herpes zoster, breast disorders, chest wall tumors, thoracic outlet syndrome, mediastinitis.   Initial impression:  Is a 36 year old female who appears uncomfortable otherwise nontoxic-appearing her vitals are very reassuring although her pulse is slightly low.  Physical exam was unremarkable.  Pain of her chest wall was reproducible with twisting motion or going from sitting to lying down.  Not reproducible with palpation.  Patient had significant work-up done a few days prior, so concern  for cardiac etiology is low.  Normal lung exam.  Low concern for PE given her history and her stable vitals.  At time of evaluation the following lab values were available CBC unremarkable BMP normal Pregnancy test negative Delta troponins negative BNP slightly elevated at 216.4 Chest x-ray normal At this point, it does seem that patient's symptoms are more likely due to to muscle strain versus cardiac or pulmonary.   Lab Tests and EKG:  I Ordered, reviewed, and interpreted labs and EKG.  The pertinent results in my decision-making regarding them are detailed in the ED course and/or initial impression section above.   Imaging Studies ordered:  I ordered imaging studies including chest x-ray I independently visualized and interpreted imaging and I agree with the radiologist interpretation. Decisions made regarding results are detailed in the ED course and/or initial impression section above.   Cardiac Monitoring:  The patient was maintained on a cardiac monitor.  I personally viewed and interpreted the cardiac monitored which showed an underlying rhythm of: NSR   Medicines ordered and prescription drug management:  I ordered medication including: Percocet for pain Reevaluation of the patient after these medicines showed that the patient resolved I have reviewed the patients home medicines and have made adjustments as needed   Disposition:  After consideration of the diagnostic results, physical exam, history and the patients response to treatment feel that the patent would benefit from discharge with outpatient follow-up.   Atypical chest pain: Patient symptoms are not consistent with PE, MI or pulmonary pathology.  Her BNP is slightly elevated, however not significantly enough to raise concern for heart failure requiring additional work-up at this time.  Her chest x-ray was normal without cardiomegaly or effusion.  Her pain is likely muscular in nature, however since she has had  numerous visits for similar complaints and with the mildly elevated BNP, feel that cardiology referral is warranted.  She does have an appointment with her primary care doctor tomorrow but I have encouraged her to keep and discuss her symptoms with.  Patient is understanding and amenable to plan.  We discussed all her labs and imaging and all questions were answered.  Return precautions were discussed.  Patient was discharged home in good condition.   Final Clinical Impression(s) / ED Diagnoses Final diagnoses:  Atypical chest  pain    Rx / DC Orders ED Discharge Orders     None         Rodena Piety 04/05/21 1919    Teressa Lower, MD 04/05/21 [phone]

## 2021-04-04 NOTE — ED Provider Triage Note (Signed)
Emergency Medicine Provider Triage Evaluation Note  Tiffany Blair , a 36 y.o. female  was evaluated in triage.  Pt complains of chest pain described as pressure that started yesterday.  Patient states pain is substernal, it feels different from the chest pain she is complaining of here in the ED 3 days ago when she was diagnosed with GERD.  No alleviating or aggravating factors.  Review of Systems  Positive: Chest pain, shortness of breath Negative: Fevers, chills, abdominal pain, N/V/D  Physical Exam  BP 120/85 (BP Location: Left Arm)    Pulse 72    Temp 98.8 F (37.1 C) (Oral)    Resp 20    Ht 5\' 2"  (1.575 m)    Wt 80.7 kg    LMP 04/04/2021    SpO2 100%    BMI 32.56 kg/m  Gen:   Awake, no distress   Resp:  Normal effort, lungs CTA bilaterally MSK:   Moves extremities without difficulty  Other:  Heart with regular rate and rhythm, no murmurs rubs or gallops.  Medical Decision Making  Medically screening exam initiated at 12:49 PM.  Appropriate orders placed.  [name] was informed that the remainder of the evaluation will be completed by another provider, this initial triage assessment does not replace that evaluation, and the importance of remaining in the ED until their evaluation is complete.     [name], [place] 04/04/21 1251

## 2021-04-04 NOTE — ED Notes (Signed)
Unsuccessful Venipuncture attempt in R AC.

## 2021-04-06 ENCOUNTER — Telehealth: Payer: Self-pay

## 2021-04-06 NOTE — Telephone Encounter (Signed)
NOTES SCANNED TO REFERRAL 

## 2021-04-11 ENCOUNTER — Ambulatory Visit (INDEPENDENT_AMBULATORY_CARE_PROVIDER_SITE_OTHER): Payer: BC Managed Care – PPO | Admitting: Internal Medicine

## 2021-04-11 ENCOUNTER — Other Ambulatory Visit: Payer: Self-pay

## 2021-04-11 VITALS — BP 90/58 | HR 66 | Ht 62.0 in | Wt 178.0 lb

## 2021-04-11 DIAGNOSIS — R079 Chest pain, unspecified: Secondary | ICD-10-CM

## 2021-04-11 NOTE — Patient Instructions (Signed)
Medication Instructions:  No Changes In Medications at this time.   *If you need a refill on your cardiac medications before your next appointment, please call your pharmacy*  Lab Work: BLOOD WORK TODAY If you have labs (blood work) drawn today and your tests are completely normal, you will receive your results only by: [facility] (if you have MyChart) OR A paper copy in the mail If you have any lab test that is abnormal or we need to change your treatment, we will call you to review the results.  Testing/Procedures: Your physician has requested that you have an echocardiogram. Echocardiography is a painless test that uses sound waves to create images of your heart. It provides your doctor with information about the size and shape of your heart and how well your hearts chambers and valves are working. You may receive an ultrasound enhancing agent through an IV if needed to better visualize your heart during the echo.This procedure takes approximately one hour. There are no restrictions for this procedure. This will take place at the [address].     Follow-Up: At [facility], you and your health needs are our priority.  As part of our continuing mission to provide you with exceptional heart care, we have created designated Provider Care Teams.  These Care Teams include your primary Cardiologist (physician) and Advanced Practice Providers (APPs -  Physician Assistants and Nurse Practitioners) who all work together to provide you with the care you need, when you need it.  We recommend signing up for the patient portal called "MyChart".  Sign up information is provided on this After Visit Summary.  MyChart is used to connect with patients for Virtual Visits (Telemedicine).  Patients are able to view lab/test results, encounter notes, upcoming appointments, etc.  Non-urgent messages can be sent to your provider as well.   To learn more about what you can do with MyChart, go  to [email or web address].    Your next appointment:   AS NEEDED   The format for your next appointment:   In Person  Provider:   Janina Mayo, MD

## 2021-04-11 NOTE — Progress Notes (Signed)
Cardiology Office Note:    Date:  04/11/2021   ID:  Tiffany Blair, DOB [birth year], MRN [id number]  PCP:  Trey Sailors, PA   [facility] Providers Cardiologist:  Janina Mayo, MD     Referring MD: Trey Sailors, [place]   CC: Chest pain  History of Present Illness:    Tiffany Blair is a 36 y.o. female with a hx of GERD, presented to the ED  for substernal chest pain, ACS was ruled out thought to be MSK referred to cardiology due to multiple ED visits for chest pain and generalized body aches   She notes prior to last ED 04/04/2021, She felt a chest pressure with shooting pain.  She noted in the AM and got worse at work. She had ACS ruled out. BNP 216. She has softer blood pressures. She notes pressure on the chest at night. She feels pain with any movement. She feels it when laying on her left side. She gets pain in the mid chest.  She has no family hx of heart disease. 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 She has tried tylenol for the pain and it does not work. She feels the pain with inspiration.  She's tried gabapentin. No orthopnea, PND or LE edema. No hx of heart disease. No recent infections.   Notably, she notes her anxiety is up. She has primary provider. She has not tolerated anxiety medications, she typically can cope.    EKG 04/06/2021- NSR, no ischemic changes   Past Medical History:  Diagnosis Date   Acid reflux    Medical history non-contributory    Pregnancy     Past Surgical History:  Procedure Laterality Date   CHOLECYSTECTOMY N/A 07/01/2015   Procedure: LAPAROSCOPIC CHOLECYSTECTOMY WITH INTRAOPERATIVE CHOLANGIOGRAM;  Surgeon: Autumn Messing III, MD;  Location: [facility];  Service: General;  Laterality: N/A;   ERCP N/A 06/30/2015   Procedure: ENDOSCOPIC RETROGRADE CHOLANGIOPANCREATOGRAPHY (ERCP);  Surgeon: Clarene Essex, MD;  Location: [facility];  Service: Endoscopy;  Laterality: N/A;   WISDOM TOOTH EXTRACTION       Current Medications: Current Meds  Medication Sig   alum & mag hydroxide-simeth (MYLANTA MAXIMUM STRENGTH) 400-400-40 MG/5ML suspension Take 30 mLs by mouth every 6 (six) hours as needed for indigestion.   famotidine (PEPCID) 20 MG tablet Take 1 tablet (20 mg total) by mouth daily.   Magnesium 400 MG TABS Take 400 mg by mouth at bedtime.   meclizine (ANTIVERT) 12.5 MG tablet Take 1 tablet (12.5 mg total) by mouth 3 (three) times daily as needed for dizziness.   ondansetron (ZOFRAN) 4 MG tablet Take 1 tablet (4 mg total) by mouth every 6 (six) hours.     Allergies:   Patient has no known allergies.   Social History   Socioeconomic History   Marital status: Single    Spouse name: Not on file   Number of children: Not on file   Years of education: Not on file   Highest education level: Not on file  Occupational History   Not on file  Tobacco Use   Smoking status: Former   Smokeless tobacco: Never  Vaping Use   Vaping Use: Never used  Substance and Sexual Activity   Alcohol use: Not Currently    Comment: occ   Drug use: Not Currently    Types: Marijuana   Sexual activity: Yes    Birth control/protection: None  Other Topics  Concern   Not on file  Social History Narrative   Not on file   Social Determinants of Health   Financial Resource Strain: Not on file  Food Insecurity: Not on file  Transportation Needs: Not on file  Physical Activity: Not on file  Stress: Not on file  Social Connections: Not on file     Family History: The patient's family history is negative for Colon cancer, Cholelithiasis, Diabetes, and Hypertension.  ROS:   Please see the history of present illness.     All other systems reviewed and are negative.  EKGs/Labs/Other Studies Reviewed:    The following studies were reviewed today:   EKG:  EKG is  ordered today.  The ekg ordered today demonstrates   NSR, no PR depression  Recent Labs: 04/01/2021: ALT 23 04/04/2021: B Natriuretic  Peptide 216.4; BUN 9; Creatinine, Ser 0.72; Hemoglobin 11.9; Platelets 263; Potassium 3.7; Sodium 140  Recent Lipid Panel No results found for: CHOL, TRIG, HDL, CHOLHDL, VLDL, LDLCALC, LDLDIRECT   Risk Assessment/Calculations:           Physical Exam:    VS:  Vitals:   04/11/21 1058  BP: (!) 90/58  Pulse: 66      BP (!) 90/58 (BP Location: Left Arm, Patient Position: Sitting, Cuff Size: Large)    Pulse 66    Ht '5\' 2"'  (1.575 m)    Wt 178 lb (80.7 kg)    LMP 04/04/2021    BMI 32.56 kg/m     Wt Readings from Last 3 Encounters:  04/11/21 178 lb (80.7 kg)  04/04/21 178 lb (80.7 kg)  04/01/21 180 lb (81.6 kg)     GEN:  Well nourished, well developed in no acute distress HEENT: Normal NECK: No JVD; No carotid bruits LYMPHATICS: No lymphadenopathy CARDIAC: RRR, no murmurs, rubs, gallops RESPIRATORY:  Clear to auscultation without rales, wheezing or rhonchi  ABDOMEN: Soft, non-tender, non-distended MUSCULOSKELETAL:  No edema; No deformity  SKIN: Warm and dry NEUROLOGIC:  Alert and oriented x 3 PSYCHIATRIC: flat affect   ASSESSMENT:    Pleuritic Chest pain: She notes chest pain that is positional and pleuritic. EKG does not show signs of pericarditis but worthwhile working up. If negative, likely MSK with persistent lifting.  PLAN:    In order of problems listed above:  TTE CRP ESR Follow up PRN     Medication Adjustments/Labs and Tests Ordered: Current medicines are reviewed at length with the patient today.  Concerns regarding medicines are outlined above.  Orders Placed This Encounter  Procedures   Sedimentation rate   C-reactive protein   EKG 12-Lead   ECHOCARDIOGRAM COMPLETE   No orders of the defined types were placed in this encounter.   Patient Instructions  Medication Instructions:  No Changes In Medications at this time.   *If you need a refill on your cardiac medications before your next appointment, please call your pharmacy*  Lab  Work: BLOOD WORK TODAY If you have labs (blood work) drawn today and your tests are completely normal, you will receive your results only by: [facility] (if you have MyChart) OR A paper copy in the mail If you have any lab test that is abnormal or we need to change your treatment, we will call you to review the results.  Testing/Procedures: Your physician has requested that you have an echocardiogram. Echocardiography is a painless test that uses sound waves to create images of your heart. It provides your doctor with information about the  size and shape of your heart and how well your hearts chambers and valves are working. You may receive an ultrasound enhancing agent through an IV if needed to better visualize your heart during the echo.This procedure takes approximately one hour. There are no restrictions for this procedure. This will take place at the [address].     Follow-Up: At [facility], you and your health needs are our priority.  As part of our continuing mission to provide you with exceptional heart care, we have created designated Provider Care Teams.  These Care Teams include your primary Cardiologist (physician) and Advanced Practice Providers (APPs -  Physician Assistants and Nurse Practitioners) who all work together to provide you with the care you need, when you need it.  We recommend signing up for the patient portal called "MyChart".  Sign up information is provided on this After Visit Summary.  MyChart is used to connect with patients for Virtual Visits (Telemedicine).  Patients are able to view lab/test results, encounter notes, upcoming appointments, etc.  Non-urgent messages can be sent to your provider as well.   To learn more about what you can do with MyChart, go to [email or web address].    Your next appointment:   AS NEEDED   The format for your next appointment:   In Person  Provider:   Janina Mayo, MD       Signed, Janina Mayo, MD  04/11/2021 11:17 AM    [place]

## 2021-04-12 LAB — C-REACTIVE PROTEIN: CRP: 6 mg/L (ref 0–10)

## 2021-04-12 LAB — SEDIMENTATION RATE: Sed Rate: 40 mm/hr — ABNORMAL HIGH (ref 0–32)

## 2021-04-26 DIAGNOSIS — K219 Gastro-esophageal reflux disease without esophagitis: Secondary | ICD-10-CM | POA: Diagnosis not present

## 2021-04-26 DIAGNOSIS — E6609 Other obesity due to excess calories: Secondary | ICD-10-CM | POA: Diagnosis not present

## 2021-04-26 DIAGNOSIS — K5904 Chronic idiopathic constipation: Secondary | ICD-10-CM | POA: Diagnosis not present

## 2021-04-26 DIAGNOSIS — G4733 Obstructive sleep apnea (adult) (pediatric): Secondary | ICD-10-CM | POA: Diagnosis not present

## 2021-04-26 DIAGNOSIS — N898 Other specified noninflammatory disorders of vagina: Secondary | ICD-10-CM | POA: Diagnosis not present

## 2021-05-03 ENCOUNTER — Other Ambulatory Visit (HOSPITAL_COMMUNITY): Payer: BC Managed Care – PPO

## 2021-05-03 ENCOUNTER — Ambulatory Visit (HOSPITAL_COMMUNITY): Payer: BC Managed Care – PPO

## 2021-05-03 ENCOUNTER — Telehealth (HOSPITAL_COMMUNITY): Payer: Self-pay | Admitting: Internal Medicine

## 2021-05-03 NOTE — Telephone Encounter (Signed)
Patient is a NO SHOW at the [facility] on 05/03/21.  Scheduler cancelled appointment after the appt time slot.  Appointment was added back in for 05/03/21 and NO SHOWED by Echo tech.  Patient is now scheduled for 05/25/21.  Patient states she went to the wrong location.

## 2021-05-03 NOTE — Telephone Encounter (Signed)
Patient was left a VM with address and arrival time at 8:05 am on 05/02/21.

## 2021-05-25 ENCOUNTER — Ambulatory Visit (HOSPITAL_COMMUNITY): Payer: BC Managed Care – PPO | Attending: Cardiology

## 2021-05-25 ENCOUNTER — Other Ambulatory Visit: Payer: Self-pay

## 2021-05-25 DIAGNOSIS — R079 Chest pain, unspecified: Secondary | ICD-10-CM

## 2021-05-25 LAB — ECHOCARDIOGRAM COMPLETE
Area-P 1/2: 3.34 cm2
S' Lateral: 3.2 cm

## 2021-07-25 ENCOUNTER — Other Ambulatory Visit: Payer: Self-pay | Admitting: Physician Assistant

## 2021-07-25 DIAGNOSIS — R1011 Right upper quadrant pain: Secondary | ICD-10-CM

## 2021-08-14 ENCOUNTER — Ambulatory Visit
Admission: RE | Admit: 2021-08-14 | Discharge: 2021-08-14 | Disposition: A | Discharge status: Home / Self Care | Payer: BC Managed Care – PPO | Source: Ambulatory Visit | Attending: Physician Assistant | Admitting: Physician Assistant

## 2021-08-14 DIAGNOSIS — R1011 Right upper quadrant pain: Secondary | ICD-10-CM

## 2022-03-24 ENCOUNTER — Ambulatory Visit
Admission: EM | Admit: 2022-03-24 | Discharge: 2022-03-24 | Disposition: A | Discharge status: Home / Self Care | Payer: 59 | Attending: Internal Medicine | Admitting: Internal Medicine

## 2022-03-24 DIAGNOSIS — A084 Viral intestinal infection, unspecified: Secondary | ICD-10-CM | POA: Diagnosis not present

## 2022-03-24 DIAGNOSIS — R112 Nausea with vomiting, unspecified: Secondary | ICD-10-CM

## 2022-03-24 DIAGNOSIS — R197 Diarrhea, unspecified: Secondary | ICD-10-CM | POA: Diagnosis not present

## 2022-03-24 MED ORDER — ONDANSETRON 4 MG PO TBDP
4.0000 mg | ORAL_TABLET | Freq: Three times a day (TID) | ORAL | 0 refills | Status: DC | PRN
Start: 2022-03-24 — End: 2022-08-04

## 2022-03-24 NOTE — ED Triage Notes (Signed)
Pt reports diarrhea x 2 days. States the stomach virus has been going around at work. States she has sharp stomach pain.

## 2022-03-24 NOTE — Discharge Instructions (Addendum)
It appears that you have a stomach virus.  I have prescribed a nausea medication for you to take as needed.  Please also eat a bland diet.  See attached instructions.  Ensure adequate fluid hydration with clear oral fluids including Gatorade, water, or Pedialyte.  Go to the ER if symptoms persist or worsens.

## 2022-03-24 NOTE — ED Provider Notes (Addendum)
[name]    CSN: [id number] Arrival date & time: 03/24/22  0933      History   Chief Complaint Chief Complaint  Patient presents with   Abdominal Pain    HPI Tiffany Blair is a 37 y.o. female.   Patient presents with nausea, vomiting, diarrhea for 2 days.  Denies blood in stool or emesis.  Patient reports that sharp, intermittent abdominal discomfort in bilateral abdomen.  Reports several people at her workplace have been diagnosed with stomach virus.  Has taken Pepto-Bismol and Tylenol with minimal improvement in symptoms.  Reports difficulty keeping food and fluids down given nausea.  Denies any associated fever, cough, upper respiratory symptoms.   Abdominal Pain   Past Medical History:  Diagnosis Date   Acid reflux    Medical history non-contributory    Pregnancy     Patient Active Problem List   Diagnosis Date Noted   Intrauterine pregnancy 07/27/2018   Subchorionic hematoma 07/27/2018   Abdominal pain during pregnancy in first trimester 07/27/2018   Ptyalism 07/27/2018   Anemia 06/30/2015   Transaminitis 06/29/2015   Calculus of gallbladder and bile duct with obstruction without cholecystitis     Past Surgical History:  Procedure Laterality Date   CHOLECYSTECTOMY N/A 07/01/2015   Procedure: LAPAROSCOPIC CHOLECYSTECTOMY WITH INTRAOPERATIVE CHOLANGIOGRAM;  Surgeon: Autumn Messing III, MD;  Location: [facility];  Service: General;  Laterality: N/A;   ERCP N/A 06/30/2015   Procedure: ENDOSCOPIC RETROGRADE CHOLANGIOPANCREATOGRAPHY (ERCP);  Surgeon: Clarene Essex, MD;  Location: [facility];  Service: Endoscopy;  Laterality: N/A;   WISDOM TOOTH EXTRACTION      OB History     Gravida  3   Para  1   Term  1   Preterm      AB  1   Living  1      SAB      IAB      Ectopic      Multiple      Live Births  1            Home Medications    Prior to Admission medications   Medication Sig Start Date End Date Taking? Authorizing  Provider  ondansetron (ZOFRAN-ODT) 4 MG disintegrating tablet Take 1 tablet (4 mg total) by mouth every 8 (eight) hours as needed for nausea or vomiting. 03/24/22  Yes Jadine Brumley, Michele Rockers, FNP  alum & mag hydroxide-simeth (MYLANTA MAXIMUM STRENGTH) 400-400-40 MG/5ML suspension Take 30 mLs by mouth every 6 (six) hours as needed for indigestion. 04/01/21   Blue, Soijett A, PA-C  famotidine (PEPCID) 20 MG tablet Take 1 tablet (20 mg total) by mouth daily. 04/01/21   Blue, Soijett A, PA-C  Magnesium 400 MG TABS Take 400 mg by mouth at bedtime. 11/10/20   Alric Ran, MD  meclizine (ANTIVERT) 12.5 MG tablet Take 1 tablet (12.5 mg total) by mouth 3 (three) times daily as needed for dizziness. 11/10/20   Alric Ran, MD    Family History Family History  Problem Relation Age of Onset   Colon cancer Neg Hx    Cholelithiasis Neg Hx    Diabetes Neg Hx    Hypertension Neg Hx     Social History Social History   Tobacco Use   Smoking status: Former   Smokeless tobacco: Never  Scientific laboratory technician Use: Never used  Substance Use Topics   Alcohol use: Not Currently    Comment: occ   Drug use: Not Currently  Types: Marijuana     Allergies   Patient has no known allergies.   Review of Systems Review of Systems Per HPI  Physical Exam Triage Vital Signs ED Triage Vitals  Enc Vitals Group     BP 03/24/22 1030 98/66     Pulse Rate 03/24/22 1030 84     Resp 03/24/22 1030 17     Temp 03/24/22 1030 98.5 F (36.9 C)     Temp Source 03/24/22 1030 Oral     SpO2 03/24/22 1030 98 %     Weight --      Height --      Head Circumference --      Peak Flow --      Pain Score 03/24/22 1029 4     Pain Loc --      Pain Edu? --      Excl. in [place]? --    No data found.  Updated Vital Signs BP 98/66 (BP Location: Left Arm)   Pulse 84   Temp 98.5 F (36.9 C) (Oral)   Resp 17   LMP 03/07/2022 (Exact Date)   SpO2 98%   Visual Acuity Right Eye Distance:   Left Eye Distance:   Bilateral  Distance:    Right Eye Near:   Left Eye Near:    Bilateral Near:     Physical Exam Constitutional:      General: She is not in acute distress.    Appearance: Normal appearance. She is not toxic-appearing or diaphoretic.  HENT:     Head: Normocephalic and atraumatic.     Mouth/Throat:     Mouth: Mucous membranes are moist.     Pharynx: No posterior oropharyngeal erythema.  Eyes:     Extraocular Movements: Extraocular movements intact.     Conjunctiva/sclera: Conjunctivae normal.  Cardiovascular:     Rate and Rhythm: Normal rate and regular rhythm.     Pulses: Normal pulses.     Heart sounds: Normal heart sounds.  Pulmonary:     Effort: Pulmonary effort is normal. No respiratory distress.     Breath sounds: Normal breath sounds.  Abdominal:     General: Bowel sounds are normal. There is no distension.     Palpations: Abdomen is soft.     Tenderness: There is no abdominal tenderness. There is no guarding.  Neurological:     General: No focal deficit present.     Mental Status: She is alert and oriented to person, place, and time. Mental status is at baseline.  Psychiatric:        Mood and Affect: Mood normal.        Behavior: Behavior normal.        Thought Content: Thought content normal.        Judgment: Judgment normal.      UC Treatments / Results  Labs (all labs ordered are listed, but only abnormal results are displayed) Labs Reviewed - No data to display  EKG   Radiology No results found.  Procedures Procedures (including critical care time)  Medications Ordered in UC Medications - No data to display  Initial Impression / Assessment and Plan / UC Course  I have reviewed the triage vital signs and the nursing notes.  Pertinent labs & imaging results that were available during my care of the patient were reviewed by me and considered in my medical decision making (see chart for details).     Symptoms appear viral in etiology.  Most likely viral  gastroenteritis given patient's close exposure.  There are no signs of acute abdomen or dehydration on exam so do not think that emergent evaluation is necessary at this time. Blood pressure appears baseline for patient.  Patient prescribed ondansetron to take as needed as she reports that she does not take any daily medications and is not currently breast-feeding so this should be safe.  Advised bland diet and ensuring adequate fluid hydration with clear oral fluids.  Patient was advised to go to the ER if symptoms persist or worsen especially abdominal discomfort.  Patient verbalized understanding and was agreeable with plan. Final Clinical Impressions(s) / UC Diagnoses   Final diagnoses:  Viral gastroenteritis  Nausea vomiting and diarrhea     Discharge Instructions      It appears that you have a stomach virus.  I have prescribed a nausea medication for you to take as needed.  Please also eat a bland diet.  See attached instructions.  Ensure adequate fluid hydration with clear oral fluids including Gatorade, water, or Pedialyte.  Go to the ER if symptoms persist or worsens.    ED Prescriptions     Medication Sig Dispense Auth. Provider   ondansetron (ZOFRAN-ODT) 4 MG disintegrating tablet Take 1 tablet (4 mg total) by mouth every 8 (eight) hours as needed for nausea or vomiting. 20 tablet [place], Michele Rockers, [place]      PDMP not reviewed this encounter.   Teodora Medici, [place] 03/24/22 [place], [place], [place] 03/24/22 1156

## 2022-08-04 ENCOUNTER — Ambulatory Visit
Admission: EM | Admit: 2022-08-04 | Discharge: 2022-08-04 | Disposition: A | Discharge status: Home / Self Care | Payer: 59 | Attending: Family Medicine | Admitting: Family Medicine

## 2022-08-04 DIAGNOSIS — A084 Viral intestinal infection, unspecified: Secondary | ICD-10-CM

## 2022-08-04 LAB — POCT URINALYSIS DIP (MANUAL ENTRY)
Bilirubin, UA: NEGATIVE
Glucose, UA: NEGATIVE mg/dL
Ketones, POC UA: NEGATIVE mg/dL
Leukocytes, UA: NEGATIVE
Nitrite, UA: NEGATIVE
Protein Ur, POC: NEGATIVE mg/dL
Spec Grav, UA: >=1.03 — AB (ref 1.010–1.025)
Urobilinogen, UA: 0.2 E.U./dL
pH, UA: 6 (ref 5.0–8.0)

## 2022-08-04 LAB — POCT URINE PREGNANCY: Preg Test, Ur: NEGATIVE

## 2022-08-04 MED ORDER — LIDOCAINE VISCOUS HCL 2 % MT SOLN
15.0000 mL | OROMUCOSAL | Status: AC
  Administered 2022-08-04: 15 mL via OROMUCOSAL

## 2022-08-04 MED ORDER — DIPHENOXYLATE-ATROPINE 2.5-0.025 MG PO TABS: 1.0000 | ORAL_TABLET | Freq: Four times a day (QID) | ORAL | 0 refills | Status: AC | PRN

## 2022-08-04 MED ORDER — ONDANSETRON 4 MG PO TBDP
4.0000 mg | ORAL_TABLET | Freq: Once | ORAL | Status: AC
  Administered 2022-08-04: 4 mg via ORAL

## 2022-08-04 MED ORDER — ONDANSETRON 4 MG PO TBDP: 4.0000 mg | ORAL_TABLET | Freq: Three times a day (TID) | ORAL | 0 refills | Status: AC | PRN

## 2022-08-04 MED ORDER — ALUM & MAG HYDROXIDE-SIMETH 200-200-20 MG/5ML PO SUSP
30.0000 mL | Freq: Once | ORAL | Status: AC
Start: 2022-08-04 — End: 2022-08-04
  Administered 2022-08-04: 30 mL via ORAL

## 2022-08-04 NOTE — Discharge Instructions (Addendum)
ED if symptoms worsen or do not improve.

## 2022-08-04 NOTE — ED Triage Notes (Signed)
Pt presents with nausea, sharp abdominal pain and a headache that she stated started yesterday. Pt also stated that she has been urinating frequency since yesterday.

## 2022-08-04 NOTE — ED Provider Notes (Signed)
[name]    CSN: [id number] Arrival date & time: 08/04/22  0900      History   Chief Complaint Chief Complaint  Patient presents with   Abdominal Pain   Headache   Nausea    HPI Tiffany Blair is a 37 y.o. female.   HPI With a history of recurrent general abdominal pain presents today for evaluation of 1 day of abdominal pain, headache and nausea.  She also endorses some urinary frequency.  She reports symptoms have been present for 24 hours.  She has had multiple episodes of watery stool.  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  Denies any fever but has felt recently warm.  She has been able to tolerate fluids but reports her taste buds are not at their baseline.  Had a poor appetite. Past Medical History:  Diagnosis Date   Acid reflux    Medical history non-contributory    Pregnancy     Patient Active Problem List   Diagnosis Date Noted   Intrauterine pregnancy 07/27/2018   Subchorionic hematoma 07/27/2018   Abdominal pain during pregnancy in first trimester 07/27/2018   Ptyalism 07/27/2018   Anemia 06/30/2015   Transaminitis 06/29/2015   Calculus of gallbladder and bile duct with obstruction without cholecystitis     Past Surgical History:  Procedure Laterality Date   CHOLECYSTECTOMY N/A 07/01/2015   Procedure: LAPAROSCOPIC CHOLECYSTECTOMY WITH INTRAOPERATIVE CHOLANGIOGRAM;  Surgeon: Chevis Pretty III, MD;  Location: [facility];  Service: General;  Laterality: N/A;   ERCP N/A 06/30/2015   Procedure: ENDOSCOPIC RETROGRADE CHOLANGIOPANCREATOGRAPHY (ERCP);  Surgeon: Vida Rigger, MD;  Location: [facility];  Service: Endoscopy;  Laterality: N/A;   WISDOM TOOTH EXTRACTION      OB History     Gravida  3   Para  1   Term  1   Preterm      AB  1   Living  1      SAB      IAB      Ectopic      Multiple      Live Births  1            Home Medications     Prior to Admission medications   Medication Sig Start Date End Date Taking? Authorizing Provider  diphenoxylate-atropine (LOMOTIL) 2.5-0.025 MG tablet Take 1 tablet by mouth 4 (four) times daily as needed for diarrhea or loose stools. 08/04/22  Yes Bing Neighbors, NP  alum & mag hydroxide-simeth (MYLANTA MAXIMUM STRENGTH) 400-400-40 MG/5ML suspension Take 30 mLs by mouth every 6 (six) hours as needed for indigestion. 04/01/21   Blue, Soijett A, PA-C  famotidine (PEPCID) 20 MG tablet Take 1 tablet (20 mg total) by mouth daily. 04/01/21   Blue, Soijett A, PA-C  Magnesium 400 MG TABS Take 400 mg by mouth at bedtime. 11/10/20   Windell Norfolk, MD  meclizine (ANTIVERT) 12.5 MG tablet Take 1 tablet (12.5 mg total) by mouth 3 (three) times daily as needed for dizziness. 11/10/20   Windell Norfolk, MD  ondansetron (ZOFRAN-ODT) 4 MG disintegrating tablet Take 1 tablet (4 mg total) by mouth every 8 (eight) hours as needed for nausea or vomiting. 08/04/22   Bing Neighbors, NP    Family History Family History  Problem Relation Age of Onset   Colon cancer Neg Hx    Cholelithiasis Neg Hx    Diabetes  Neg Hx    Hypertension Neg Hx     Social History Social History   Tobacco Use   Smoking status: Former   Smokeless tobacco: Never  Building services engineer Use: Never used  Substance Use Topics   Alcohol use: Not Currently    Comment: occ   Drug use: Not Currently    Types: Marijuana     Allergies   Patient has no known allergies.   Review of Systems Review of Systems Pertinent negatives listed in HPI  Physical Exam Triage Vital Signs ED Triage Vitals  Enc Vitals Group     BP 08/04/22 0908 93/69     Pulse Rate 08/04/22 0908 80     Resp 08/04/22 0908 17     Temp 08/04/22 0908 98.8 F (37.1 C)     Temp src --      SpO2 08/04/22 0908 96 %     Weight --      Height --      Head Circumference --      Peak Flow --      Pain Score 08/04/22 0906 8     Pain Loc --      Pain Edu? --       Excl. in [place]? --    No data found.  Updated Vital Signs BP 93/69   Pulse 80   Temp 98.8 F (37.1 C)   Resp 17   LMP 07/20/2022   SpO2 96%   Visual Acuity Right Eye Distance:   Left Eye Distance:   Bilateral Distance:    Right Eye Near:   Left Eye Near:    Bilateral Near:     Physical Exam Constitutional:      Appearance: She is well-developed.  HENT:     Head: Normocephalic.  Eyes:     Extraocular Movements: Extraocular movements intact.     Pupils: Pupils are equal, round, and reactive to light.  Cardiovascular:     Rate and Rhythm: Normal rate and regular rhythm.  Pulmonary:     Effort: Pulmonary effort is normal.     Breath sounds: Normal breath sounds.  Abdominal:     Tenderness: There is generalized abdominal tenderness. There is no guarding or rebound.  Musculoskeletal:     Cervical back: Normal range of motion.  Skin:    General: Skin is warm and dry.  Neurological:     Mental Status: She is alert.     GCS: GCS eye subscore is 4. GCS verbal subscore is 5. GCS motor subscore is 6.      UC Treatments / Results  Labs (all labs ordered are listed, but only abnormal results are displayed) Labs Reviewed  POCT URINALYSIS DIP (MANUAL ENTRY) - Abnormal; Notable for the following components:      Result Value   Spec Grav, UA >=1.030 (*)    Blood, UA small (*)    All other components within normal limits  POCT URINE PREGNANCY    EKG   Radiology No results found.  Procedures Procedures (including critical care time)  Medications Ordered in UC Medications  alum & mag hydroxide-simeth (MAALOX/MYLANTA) 200-200-20 MG/5ML suspension 30 mL (has no administration in time range)  lidocaine (XYLOCAINE) 2 % viscous mouth solution 15 mL (has no administration in time range)  ondansetron (ZOFRAN-ODT) disintegrating tablet 4 mg (has no administration in time range)    Initial Impression / Assessment and Plan / UC Course  I have reviewed the triage vital  signs  and the nursing notes.  Pertinent labs & imaging results that were available during my care of the patient were reviewed by me and considered in my medical decision making (see chart for details).    Viral gastroenteritis, low suspicion for acute abdomen.  Patient given strict ED precautions if symptoms worsen or do not improve.  Patient given a GI cocktail here in clinic and tolerated.  Home management with Zofran and Lomotil for diarrheal symptoms.  Patient verbalized understanding and agreement with today's plan. Final Clinical Impressions(s) / UC Diagnoses   Final diagnoses:  Viral gastroenteritis     Discharge Instructions      ED if symptoms worsen or do not improve.     ED Prescriptions     Medication Sig Dispense Auth. Provider   ondansetron (ZOFRAN-ODT) 4 MG disintegrating tablet Take 1 tablet (4 mg total) by mouth every 8 (eight) hours as needed for nausea or vomiting. 20 tablet Bing Neighbors, NP   diphenoxylate-atropine (LOMOTIL) 2.5-0.025 MG tablet Take 1 tablet by mouth 4 (four) times daily as needed for diarrhea or loose stools. 14 tablet Bing Neighbors, NP      I have reviewed the PDMP during this encounter.   Bing Neighbors, NP 08/04/22 [phone]

## 2023-06-05 ENCOUNTER — Emergency Department (HOSPITAL_COMMUNITY)
Admission: EM | Admit: 2023-06-05 | Discharge: 2023-06-05 | Disposition: A | Discharge status: Home / Self Care | Attending: Emergency Medicine | Admitting: Emergency Medicine

## 2023-06-05 ENCOUNTER — Other Ambulatory Visit: Payer: Self-pay

## 2023-06-05 ENCOUNTER — Emergency Department (HOSPITAL_COMMUNITY)

## 2023-06-05 DIAGNOSIS — M545 Low back pain, unspecified: Secondary | ICD-10-CM | POA: Diagnosis present

## 2023-06-05 DIAGNOSIS — M542 Cervicalgia: Secondary | ICD-10-CM | POA: Diagnosis not present

## 2023-06-05 DIAGNOSIS — M5442 Lumbago with sciatica, left side: Secondary | ICD-10-CM | POA: Diagnosis not present

## 2023-06-05 LAB — POC URINE PREG, ED: Preg Test, Ur: NEGATIVE

## 2023-06-05 MED ORDER — ACETAMINOPHEN 500 MG PO TABS
1000.0000 mg | ORAL_TABLET | Freq: Once | ORAL | Status: AC
Start: 2023-06-05 — End: 2023-06-05
  Administered 2023-06-05: 1000 mg via ORAL
  Filled 2023-06-05: qty 2

## 2023-06-05 MED ORDER — IBUPROFEN 600 MG PO TABS: 600.0000 mg | ORAL_TABLET | Freq: Four times a day (QID) | ORAL | 0 refills | Status: DC | PRN

## 2023-06-05 MED ORDER — IBUPROFEN 600 MG PO TABS: 600.0000 mg | ORAL_TABLET | Freq: Four times a day (QID) | ORAL | 0 refills | Status: AC | PRN

## 2023-06-05 MED ORDER — METHOCARBAMOL 500 MG PO TABS: 500.0000 mg | ORAL_TABLET | Freq: Two times a day (BID) | ORAL | 0 refills | Status: AC | PRN

## 2023-06-05 MED ORDER — METHOCARBAMOL 500 MG PO TABS: 500.0000 mg | ORAL_TABLET | Freq: Two times a day (BID) | ORAL | 0 refills | Status: DC | PRN

## 2023-06-05 MED ORDER — KETOROLAC TROMETHAMINE 15 MG/ML IJ SOLN
15.0000 mg | Freq: Once | INTRAMUSCULAR | Status: AC
Start: 2023-06-05 — End: 2023-06-05
  Administered 2023-06-05: 15 mg via INTRAMUSCULAR
  Filled 2023-06-05: qty 1
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
## 2023-09-24 ENCOUNTER — Ambulatory Visit: Payer: Self-pay | Attending: Physician Assistant | Admitting: Physical Therapy

## 2023-09-24 DIAGNOSIS — M545 Low back pain, unspecified: Secondary | ICD-10-CM | POA: Insufficient documentation

## 2023-09-24 DIAGNOSIS — M6281 Muscle weakness (generalized): Secondary | ICD-10-CM | POA: Insufficient documentation

## 2023-09-24 DIAGNOSIS — R262 Difficulty in walking, not elsewhere classified: Secondary | ICD-10-CM | POA: Insufficient documentation

## 2023-09-24 DIAGNOSIS — R293 Abnormal posture: Secondary | ICD-10-CM | POA: Insufficient documentation

## 2023-09-24 DIAGNOSIS — G8929 Other chronic pain: Secondary | ICD-10-CM | POA: Insufficient documentation

## 2023-09-24 NOTE — Patient Instructions (Signed)
 Posture Tips DO: - stand tall and erect - keep chin tucked in - keep head and shoulders in alignment - check posture regularly in mirror or large window - pull head back against headrest in car seat;  Change your position often.  Sit with lumbar support. DON'T: - slouch or slump while watching TV or reading - sit, stand or lie in one position  for too long;  Sitting is especially hard on the spine so if you sit at a desk/use the computer, then stand up often!   Copyright  VHI. All rights reserved.  Posture - Standing   Good posture is important. Avoid slouching and forward head thrust. Maintain curve in low back and align ears over shoul- ders, hips over ankles.  Pull your belly button in toward your back bone. Stand with even weight in feet and Ribs lifted up and chin down  Copyright  VHI. All rights reserved.  Posture - Sitting   Sit upright, head facing forward. Try using a roll to support lower back. Keep shoulders relaxed, and avoid rounded back. Keep hips level with knees. Avoid crossing legs for long periods. Sit on Sit bones not tail bone. Don't cross legs   Copyright  VHI. All rights reserved.    Graydon Dingwall, PT, ATRIC Certified Exercise Expert for the Aging Adult  09/24/23 8:05 AM Phone: [phone] Fax: [phone]

## 2023-10-01 ENCOUNTER — Ambulatory Visit: Payer: Self-pay

## 2023-10-01 DIAGNOSIS — M545 Low back pain, unspecified: Secondary | ICD-10-CM

## 2023-10-01 DIAGNOSIS — R262 Difficulty in walking, not elsewhere classified: Secondary | ICD-10-CM

## 2023-10-01 DIAGNOSIS — R293 Abnormal posture: Secondary | ICD-10-CM

## 2023-10-01 DIAGNOSIS — M6281 Muscle weakness (generalized): Secondary | ICD-10-CM
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
## 2023-10-03 ENCOUNTER — Ambulatory Visit: Payer: Self-pay

## 2023-10-03 DIAGNOSIS — R262 Difficulty in walking, not elsewhere classified: Secondary | ICD-10-CM

## 2023-10-03 DIAGNOSIS — G8929 Other chronic pain: Secondary | ICD-10-CM

## 2023-10-03 DIAGNOSIS — R293 Abnormal posture: Secondary | ICD-10-CM

## 2023-10-03 DIAGNOSIS — M6281 Muscle weakness (generalized): Secondary | ICD-10-CM
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
## 2023-10-10 ENCOUNTER — Ambulatory Visit: Payer: Self-pay

## 2023-10-10 DIAGNOSIS — M6281 Muscle weakness (generalized): Secondary | ICD-10-CM

## 2023-10-10 DIAGNOSIS — R293 Abnormal posture: Secondary | ICD-10-CM

## 2023-10-10 DIAGNOSIS — M545 Low back pain, unspecified: Secondary | ICD-10-CM

## 2023-10-10 DIAGNOSIS — R262 Difficulty in walking, not elsewhere classified: Secondary | ICD-10-CM
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
## 2023-10-15 ENCOUNTER — Ambulatory Visit: Payer: Self-pay | Attending: Physician Assistant

## 2023-10-15 DIAGNOSIS — M6281 Muscle weakness (generalized): Secondary | ICD-10-CM | POA: Insufficient documentation

## 2023-10-15 DIAGNOSIS — R293 Abnormal posture: Secondary | ICD-10-CM | POA: Insufficient documentation

## 2023-10-15 DIAGNOSIS — R262 Difficulty in walking, not elsewhere classified: Secondary | ICD-10-CM | POA: Insufficient documentation

## 2023-10-15 DIAGNOSIS — M545 Low back pain, unspecified: Secondary | ICD-10-CM | POA: Insufficient documentation

## 2023-10-15 DIAGNOSIS — G8929 Other chronic pain: Secondary | ICD-10-CM | POA: Insufficient documentation
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
## 2023-10-17 ENCOUNTER — Ambulatory Visit: Payer: Self-pay

## 2023-10-17 DIAGNOSIS — G8929 Other chronic pain: Secondary | ICD-10-CM

## 2023-10-17 DIAGNOSIS — R293 Abnormal posture: Secondary | ICD-10-CM

## 2023-10-17 DIAGNOSIS — M6281 Muscle weakness (generalized): Secondary | ICD-10-CM
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
## 2023-10-22 ENCOUNTER — Ambulatory Visit: Payer: Self-pay

## 2023-10-22 DIAGNOSIS — G8929 Other chronic pain: Secondary | ICD-10-CM

## 2023-10-22 DIAGNOSIS — R293 Abnormal posture: Secondary | ICD-10-CM

## 2023-10-22 DIAGNOSIS — M6281 Muscle weakness (generalized): Secondary | ICD-10-CM

## 2023-10-22 DIAGNOSIS — M545 Other chronic pain: Secondary | ICD-10-CM

## 2023-10-24 ENCOUNTER — Encounter: Payer: Self-pay | Admitting: Physical Therapy

## 2023-10-24 ENCOUNTER — Ambulatory Visit: Payer: Self-pay | Admitting: Physical Therapy

## 2023-10-24 DIAGNOSIS — M6281 Muscle weakness (generalized): Secondary | ICD-10-CM

## 2023-10-24 DIAGNOSIS — G8929 Other chronic pain: Secondary | ICD-10-CM

## 2023-10-24 DIAGNOSIS — R293 Abnormal posture: Secondary | ICD-10-CM

## 2023-10-24 DIAGNOSIS — R262 Difficulty in walking, not elsewhere classified: Secondary | ICD-10-CM
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
